# Patient Record
Sex: Female | Born: 1960 | Race: White | Hispanic: No | Marital: Married | Smoking: Never smoker
Health system: Southern US, Community
[De-identification: ages and names within clinical notes are randomized; demographics above are authoritative.]

## PROBLEM LIST (undated history)

## (undated) DIAGNOSIS — K2210 Ulcer of esophagus without bleeding: Secondary | ICD-10-CM

## (undated) DIAGNOSIS — K219 Gastro-esophageal reflux disease without esophagitis: Secondary | ICD-10-CM

## (undated) DIAGNOSIS — H269 Unspecified cataract: Secondary | ICD-10-CM

## (undated) DIAGNOSIS — E611 Iron deficiency: Secondary | ICD-10-CM

## (undated) DIAGNOSIS — E039 Hypothyroidism, unspecified: Secondary | ICD-10-CM

## (undated) DIAGNOSIS — L111 Transient acantholytic dermatosis [Grover]: Secondary | ICD-10-CM

## (undated) HISTORY — PX: TONSILLECTOMY AND ADENOIDECTOMY: SUR1326

## (undated) HISTORY — DX: Gastro-esophageal reflux disease without esophagitis: K21.9

## (undated) HISTORY — DX: Hypothyroidism, unspecified: E03.9

## (undated) HISTORY — PX: PATENT FORAMEN OVALE CLOSURE: SHX2181

## (undated) HISTORY — DX: Iron deficiency: E61.1

## (undated) HISTORY — DX: Transient acantholytic dermatosis (grover): L11.1

## (undated) HISTORY — PX: ADENOIDECTOMY: SUR15

## (undated) HISTORY — DX: Ulcer of esophagus without bleeding: K22.10

## (undated) HISTORY — PX: APPENDECTOMY: SHX54

---

## 1998-01-03 ENCOUNTER — Other Ambulatory Visit: Admission: RE | Admit: 1998-01-03 | Discharge: 1998-01-03 | Payer: Self-pay | Admitting: Obstetrics and Gynecology

## 1998-03-06 ENCOUNTER — Ambulatory Visit (HOSPITAL_COMMUNITY): Admission: RE | Admit: 1998-03-06 | Discharge: 1998-03-06 | Payer: Self-pay | Admitting: Internal Medicine

## 1999-02-07 ENCOUNTER — Other Ambulatory Visit: Admission: RE | Admit: 1999-02-07 | Discharge: 1999-02-07 | Payer: Self-pay | Admitting: Obstetrics and Gynecology

## 2000-05-30 ENCOUNTER — Other Ambulatory Visit: Admission: RE | Admit: 2000-05-30 | Discharge: 2000-05-30 | Payer: Self-pay | Admitting: Obstetrics and Gynecology

## 2000-11-17 ENCOUNTER — Other Ambulatory Visit: Admission: RE | Admit: 2000-11-17 | Discharge: 2000-11-17 | Payer: Self-pay | Admitting: Otolaryngology

## 2000-11-17 ENCOUNTER — Encounter (INDEPENDENT_AMBULATORY_CARE_PROVIDER_SITE_OTHER): Payer: Self-pay | Admitting: *Deleted

## 2001-03-27 ENCOUNTER — Encounter: Payer: Self-pay | Admitting: Gastroenterology

## 2001-04-07 ENCOUNTER — Encounter: Payer: Self-pay | Admitting: Gastroenterology

## 2001-04-20 ENCOUNTER — Ambulatory Visit (HOSPITAL_COMMUNITY): Admission: RE | Admit: 2001-04-20 | Discharge: 2001-04-20 | Payer: Self-pay | Admitting: Gastroenterology

## 2001-04-20 ENCOUNTER — Encounter: Payer: Self-pay | Admitting: Gastroenterology

## 2001-08-18 ENCOUNTER — Other Ambulatory Visit: Admission: RE | Admit: 2001-08-18 | Discharge: 2001-08-18 | Payer: Self-pay | Admitting: Obstetrics and Gynecology

## 2001-08-28 ENCOUNTER — Encounter (INDEPENDENT_AMBULATORY_CARE_PROVIDER_SITE_OTHER): Payer: Self-pay | Admitting: Specialist

## 2001-08-28 ENCOUNTER — Other Ambulatory Visit: Admission: RE | Admit: 2001-08-28 | Discharge: 2001-08-28 | Payer: Self-pay | Admitting: Radiology

## 2001-08-28 ENCOUNTER — Encounter: Admission: RE | Admit: 2001-08-28 | Discharge: 2001-08-28 | Payer: Self-pay | Admitting: General Surgery

## 2001-08-28 ENCOUNTER — Encounter: Payer: Self-pay | Admitting: General Surgery

## 2002-08-23 ENCOUNTER — Ambulatory Visit (HOSPITAL_COMMUNITY): Admission: RE | Admit: 2002-08-23 | Discharge: 2002-08-23 | Payer: Self-pay | Admitting: Neurology

## 2002-08-23 ENCOUNTER — Encounter: Payer: Self-pay | Admitting: Neurology

## 2002-08-25 ENCOUNTER — Other Ambulatory Visit: Admission: RE | Admit: 2002-08-25 | Discharge: 2002-08-25 | Payer: Self-pay | Admitting: Obstetrics and Gynecology

## 2002-11-16 ENCOUNTER — Encounter (INDEPENDENT_AMBULATORY_CARE_PROVIDER_SITE_OTHER): Payer: Self-pay | Admitting: *Deleted

## 2002-11-16 ENCOUNTER — Ambulatory Visit (HOSPITAL_BASED_OUTPATIENT_CLINIC_OR_DEPARTMENT_OTHER): Admission: RE | Admit: 2002-11-16 | Discharge: 2002-11-17 | Payer: Self-pay | Admitting: Otolaryngology

## 2003-04-25 ENCOUNTER — Encounter: Admission: RE | Admit: 2003-04-25 | Discharge: 2003-04-25 | Payer: Self-pay | Admitting: Chiropractic Medicine

## 2003-09-14 ENCOUNTER — Other Ambulatory Visit: Admission: RE | Admit: 2003-09-14 | Discharge: 2003-09-14 | Payer: Self-pay | Admitting: Obstetrics and Gynecology

## 2003-09-15 ENCOUNTER — Encounter: Admission: RE | Admit: 2003-09-15 | Discharge: 2003-09-15 | Payer: Self-pay | Admitting: Obstetrics and Gynecology

## 2003-10-31 ENCOUNTER — Encounter: Payer: Self-pay | Admitting: Internal Medicine

## 2004-04-18 ENCOUNTER — Encounter: Admission: RE | Admit: 2004-04-18 | Discharge: 2004-04-18 | Payer: Self-pay | Admitting: Obstetrics and Gynecology

## 2004-06-06 ENCOUNTER — Inpatient Hospital Stay (HOSPITAL_COMMUNITY): Admission: EM | Admit: 2004-06-06 | Discharge: 2004-06-09 | Payer: Self-pay | Admitting: Emergency Medicine

## 2004-10-12 ENCOUNTER — Other Ambulatory Visit: Admission: RE | Admit: 2004-10-12 | Discharge: 2004-10-12 | Payer: Self-pay | Admitting: Obstetrics and Gynecology

## 2006-07-14 ENCOUNTER — Inpatient Hospital Stay (HOSPITAL_COMMUNITY): Admission: AD | Admit: 2006-07-14 | Discharge: 2006-07-16 | Payer: Self-pay | Admitting: Internal Medicine

## 2006-08-05 ENCOUNTER — Ambulatory Visit (HOSPITAL_COMMUNITY): Admission: RE | Admit: 2006-08-05 | Discharge: 2006-08-05 | Payer: Self-pay | Admitting: Internal Medicine

## 2006-10-21 ENCOUNTER — Ambulatory Visit: Payer: Self-pay | Admitting: Cardiology

## 2006-10-21 ENCOUNTER — Inpatient Hospital Stay (HOSPITAL_COMMUNITY): Admission: EM | Admit: 2006-10-21 | Discharge: 2006-10-23 | Payer: Self-pay | Admitting: Emergency Medicine

## 2008-05-05 DIAGNOSIS — Z8719 Personal history of other diseases of the digestive system: Secondary | ICD-10-CM

## 2008-05-05 DIAGNOSIS — K449 Diaphragmatic hernia without obstruction or gangrene: Secondary | ICD-10-CM | POA: Insufficient documentation

## 2008-05-05 DIAGNOSIS — K222 Esophageal obstruction: Secondary | ICD-10-CM

## 2008-05-05 DIAGNOSIS — K219 Gastro-esophageal reflux disease without esophagitis: Secondary | ICD-10-CM | POA: Insufficient documentation

## 2008-05-05 DIAGNOSIS — J45909 Unspecified asthma, uncomplicated: Secondary | ICD-10-CM | POA: Insufficient documentation

## 2008-05-09 ENCOUNTER — Ambulatory Visit: Payer: Self-pay | Admitting: Internal Medicine

## 2008-05-09 DIAGNOSIS — K5901 Slow transit constipation: Secondary | ICD-10-CM

## 2008-05-19 ENCOUNTER — Ambulatory Visit: Payer: Self-pay | Admitting: Internal Medicine

## 2008-06-14 ENCOUNTER — Telehealth: Payer: Self-pay | Admitting: Internal Medicine

## 2008-07-04 ENCOUNTER — Ambulatory Visit: Payer: Self-pay | Admitting: Diagnostic Radiology

## 2008-07-04 ENCOUNTER — Emergency Department (HOSPITAL_BASED_OUTPATIENT_CLINIC_OR_DEPARTMENT_OTHER): Admission: EM | Admit: 2008-07-04 | Discharge: 2008-07-05 | Payer: Self-pay | Admitting: Emergency Medicine

## 2008-09-16 ENCOUNTER — Encounter (INDEPENDENT_AMBULATORY_CARE_PROVIDER_SITE_OTHER): Payer: Self-pay | Admitting: *Deleted

## 2008-11-22 ENCOUNTER — Encounter: Admission: RE | Admit: 2008-11-22 | Discharge: 2008-11-22 | Payer: Self-pay | Admitting: Allergy and Immunology

## 2010-08-02 LAB — BASIC METABOLIC PANEL
BUN: 13 mg/dL (ref 6–23)
CO2: 23 mEq/L (ref 19–32)
Calcium: 8.8 mg/dL (ref 8.4–10.5)
GFR calc Af Amer: >60 mL/min (ref >60)
Potassium: 3.6 mEq/L (ref 3.5–5.1)
Sodium: 139 mEq/L (ref 135–145)

## 2010-08-02 LAB — D-DIMER, QUANTITATIVE: D-Dimer, Quant: 0.31 ug/mL-FEU (ref 0.00–0.48)

## 2010-08-02 LAB — POCT CARDIAC MARKERS
CKMB, poc: <1 ng/mL — ABNORMAL LOW (ref 1.0–8.0)
Troponin i, poc: <0.05 ng/mL (ref 0.00–0.09)

## 2010-08-02 LAB — DIFFERENTIAL
Eosinophils Absolute: 0.4 10*3/uL (ref 0.0–0.7)
Eosinophils Relative: 4 % (ref 0–5)
Lymphocytes Relative: 27 % (ref 12–46)
Lymphs Abs: 2.6 10*3/uL (ref 0.7–4.0)
Monocytes Relative: 10 % (ref 3–12)

## 2010-08-02 LAB — CBC
Hemoglobin: 13.9 g/dL (ref 12.0–15.0)
MCHC: 34.7 g/dL (ref 30.0–36.0)
Platelets: 272 10*3/uL (ref 150–400)
RDW: 12.8 % (ref 11.5–15.5)

## 2010-09-04 NOTE — Discharge Summary (Signed)
NAMEBRANDA, CHAUDHARY               ACCOUNT NO.:  [id number]   MEDICAL RECORD NO.:  [id number]          PATIENT TYPE:  INP   LOCATION:  3027                         FACILITY:  [facility]   PHYSICIAN:  Pramod P. Pearlean Brownie, MD    DATE OF BIRTH:  01/31/1961   DATE OF ADMISSION:  10/21/2006  DATE OF DISCHARGE:  10/23/2006                               DISCHARGE SUMMARY   DIAGNOSIS AT TIME OF DISCHARGE:  1. 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      Etiology unclear at time of discharge.  2. Dyslipidemia.  3. Asthma.   MEDICATIONS:  At time of discharge:  1. Aspirin 81 mg a day.  2. Wellbutrin 300 mg a day.  3. Zocor 20 mg a day.   STUDIES PERFORMED:  1. CT of the brain on admission shows no acute abnormality.  2. MRI of the brain shows no acute infarct.  3. MRA of the brain was negative.  4. A 2-D echocardiogram was completed, results pending.  5. Carotid Doppler done, results pending.  6. Transcranial Doppler done, results pending.  7. EKG shows sinus rhythm with sinus arrhythmia, low-voltage QRS.   LABORATORY STUDIES:  CBC normal.  Chemistry normal.  Coagulation studies  normal.  Liver function tests normal.  Cardiac enzymes negative.  Cholesterol 192, triglycerides 91, HDL 59, LDL 115.  Urinalysis was  negative.  Hypercoagulable panel was pending.  Homocystine is 7.1,  hemoglobin A1c 5.2 and urine pregnancy test is negative.   HISTORY OF PRESENT ILLNESS:  [name] is a 50 year old  Caucasian female with a past medical history of asthma who presents  around 6:30 p.m. with right upper extremity flaccid paresis and numbness  with the patient unable to move her arm at all.  The patient states her  symptoms have gradually improved over 1 minute, especially after shaking  the arm.  She still complains of some slight right upper and lower  extremity tingling.  The patient then drove home from work and felt  tingling over her whole body.  She complained of a  faint-like feeling.  She states that when she was driving she also felt like her right lower  extremity was weak and clumsy.  However, she could use it appropriately.  She denies any visual changes, speech changes, swallowing problems,  chewing problems, vertigo or falls.  The patient did have a recent face  plastic surgery done the end of May with subsequent hematoma that  requires revision.  The patient was not a TPA candidate secondary to  quick resolution of symptoms.  She was admitted to the hospital for  further stroke evaluation.   HOSPITAL COURSE:  MRI did not reveal acute stroke, but given history it  is felt that the patient did have an acute stroke, not seen on MRI.  She  still has some mild right hemiparesis in her arm and her leg with some  dysmetria in her right upper extremity.  She was evaluated by PT and OT  and not felt to need outpatient therapy follow up, but exercises were  given for the patient to do at home.  She has no significant risk  factors other than a slightly elevated LDL of 115 for which she was  prescribed Zocor.  The patient states her diet has been poor within the  last one month, thinking that bacon may have increased her LDL as she  has always had low cholesterol levels.  She requests that she follow up  with her primary care physician before taking the medicines.  A  prescription has been provided along with education at the time of  discharge.  As patient also had no significant risk factors,  hypercoagulable panel was done in the hospital and results are pending  at time of discharge.  Have also asked her to follow up with Dr. Meryl Dare  to do an outpatient bubble study and emboli monitoring for possible  embolic source after discharge.  At this point, she is stable to be  discharged home.  She will take aspirin for stroke prevention.  She has  been asked to stop birth control pills.  Statin has been recommended.  It is advised that she not have  general anesthesia, surgery within the  next 2 months or be off aspirin during that time period as her stroke  risk is high.   CONDITION ON DISCHARGE:  The patient alert and oriented x3.  No aphasia.  No dysarthria.  Eye movements are full.  Face is symmetric.  No arm  drift.  She has decreased fine motor movement on her right.  Her right  arm orbits her left.  She has no lower extremity weakness.   DISCHARGE/PLAN:  1. Discharge home with family.  2. Aspirin for secondary stroke prevention.  3. Statin recommended.  The patient to discuss with primary care      physician.  Prescription provided.  4. Stop estrogen-containing birth control pills.  Progesterone only      pills are okay.  Have asked her to follow up with her OB/GYN.  5. The patient has advised to stay hydrated.  6. Outpatient bubble study and emboli monitoring with Dr. Meryl Dare within      the next month.   FOLLOW UP:  Dr. Kennedy Bucker within one week.      Annie Main, N.P.    ______________________________  Sunny Schlein. Pearlean Brownie, MD    SB/MEDQ  D:  10/23/2006  T:  10/24/2006  Job:  [id number]   cc:   Pramod P. Pearlean Brownie, MD  Gwen Pounds, MD

## 2010-09-04 NOTE — H&P (Signed)
NAMEBRITA, JURGENSEN               ACCOUNT NO.:  [id number]   MEDICAL RECORD NO.:  [id number]          PATIENT TYPE:  INP   LOCATION:  3027                         FACILITY:  [facility]   PHYSICIAN:  Bevelyn Buckles. Champey, M.D.DATE OF BIRTH:  [birth year]   DATE OF ADMISSION:  10/21/2006  DATE OF DISCHARGE:                              HISTORY & PHYSICAL   REQUESTING PHYSICIAN:  Dr. Freida Busman.   REASON FOR ADMISSION:  Code stroke.   HPI:  [name] is a 50 year old Caucasian female with a past medical  history of asthma who presents with the onset around 6:30 p.m. of right  upper extremity flaccid paresis and numbness, the patient could not move  her extremity at all.  The patient states her symptoms gradually  improved over 1 minute, especially after shaking the arm.  She still  complains of some slight right upper and lower extremity tingling.  The  patient then drove home from work and felt tingling over her entire  body.  She also complained of a faint-like feeling.  She states that  when she was driving, she also felt like her right lower extremity was  weak and clumsy; however, could use it appropriately.  She denies any  vision changes, speech changes, swallowing problems, chewing problems,  vertigo, or falls.  The patient did have a recent face plastic surgery  done at the end of May with subsequent hematoma.   PAST MEDICAL HISTORY:  Is positive for asthma.   CURRENT MEDICATIONS:  Include birth control, Wellbutrin, and Ventolin  nebs p.r.n.   ALLERGIES:  CODEINE.   FAMILY HISTORY:  Is positive for cancer.   SOCIAL HISTORY:  The patient lives with fiancee and father.  Denies any  alcohol or tobacco use.   REVIEW OF SYSTEMS:  Is positive as per HPI.  Reviews of systems negative  as per HPI in greater than 8 other organ systems.   EXAMINATION:  VITALS:  Temperature is 96.8, blood pressure 116/82, pulse  is 96, respirations 18, O2 sat is 98%.  HEENT:  Normocephalic, atraumatic.   Extraocular muscles are intact.  Visual fields are intact.  Pupils equal, round, reactive to light.  NECK:  Supple.  No carotid bruits.  HEART:  Regular.  LUNGS:  Clear.  ABDOMEN:  Soft.  EXTREMITIES:  Show good pulses.  NEUROLOGICAL EXAMINATION:  The patient is awake, alert.  Cranial nerves  II through XII are grossly intact.  Motor examination:  Patient might  have subtle 5-/5 strength in right lower extremity with a slight drift,  otherwise the patient has no drift in the other extremities and good  strength, 5/5, on the left.  Examination:  The patient does seem to have  good strength in the right upper extremity which is greatly improved, no  drift noted.  Sensory examination:  The patient has decreased sensation  to light touch and pinprick on the right when compared to the left.  She  does have the sensation.  No extension is noticed.  Reflexes are 1+.  Right toes are downgoing bilaterally.  Cerebellar function is within  normal  limits to finger-to-nose and heel-to-shin.  Gait is not assessed  secondary to safety.  NIH stroke scale was 2.   CT of head showed no acute bleed or infarct.   LABS:  Urine pregnancy was negative.  WBC is 8.4, hemoglobin 14.0,  hematocrit is 41.7, platelets 333.  Sodium is 139, potassium is 4.0,  chloride is 106, CO2 is 25, BUN 13, creatinine 0.9, glucose 95.   IMPRESSION:  This is a 50 year old Caucasian female with right-sided  weakness and numbness which has dramatically improved over the past few  hours.  The patient is not a candidate for intravenous tissue  plasminogen activator as her symptoms have dramatically improved.  We  will admit the patient to stroke MD service with the diagnosis of left  brain transient ischemic attack versus a small infarct.  We will get an  MRI/MRA of the brain, carotid Dopplers, 2-D echocardiogram, fasting  lipids, and homocysteine level.  We will start the patient on a baby  aspirin per day.  We will get Physical  Therapy/Occupational Therapy  consult.  I have recommended stopping birth control pills.  We will  place the patient on deep vein thrombosis prophylaxis.  We will follow  the patient.      Bevelyn Buckles. Nash Shearer, M.D.  Electronically Signed     DRC/MEDQ  D:  10/21/2006  T:  10/22/2006  Job:  [id number]

## 2010-09-07 NOTE — Op Note (Signed)
   NAME:  Kelsey Decker, Kelsey Decker                         ACCOUNT NO.:  [id number]   MEDICAL RECORD NO.:  [id number]                   PATIENT TYPE:  AMB   LOCATION:  DSC                                  FACILITY:  [facility]   PHYSICIAN:  Christopher E. Ezzard Standing, M.D.         DATE OF BIRTH:  1960/07/08   DATE OF PROCEDURE:  11/16/2002  DATE OF DISCHARGE:  11/17/2002                                 OPERATIVE REPORT   PREOPERATIVE DIAGNOSES:  1. Chronic tonsillitis.  2. Elongated uvula.   POSTOPERATIVE DIAGNOSES:  1. Chronic tonsillitis.  2. Elongated uvula.   PROCEDURE:  Tonsillectomy with uvulectomy.   SURGEON:  Kristine Garbe. Ezzard Standing, M.D.   ANESTHESIA:  General endotracheal anesthesia.   COMPLICATIONS:  None.   BRIEF CLINICAL NOTE:  The patient is a 50 year old female who has had  frequent sore throats as well as large cryptic tonsils with a white ring  within the tonsillar crypts.  She also has an elongated uvula with snoring  and is taken to the operating room at this time for a tonsillectomy and  uvulectomy.   DESCRIPTION OF PROCEDURE:  After adequate endotracheal anesthesia the  patient received 10 mg of Decadron IV preoperatively as well as 1 gram of  Ancef IV preoperatively.  The mouth gag was used to expose the oropharynx.  The left and right tonsils were dissected from the tonsillar fossa using  cautery.  Hemostasis was obtained with the cautery.  Following this the  uvula was transected at its base.  Hemostasis was obtained with the cautery.  This completed the procedure.  The patient tolerated the procedure well, was  awoke from anesthesia and transferred to the recovery room postoperatively  doing well.   DISPOSITION:  The patient is discharged home later this morning on  amoxicillin 400 mg b.i.d. for one week, Tylenol and Lortab elixir 15 to 30  mL q.4h p.r.n. pain.  She will have a follow-up in my office in two weeks  for recheck.             Kristine Garbe. Ezzard Standing, M.D.    CEN/MEDQ  D:  12/07/2002  T:  12/07/2002  Job:  [id number]

## 2010-09-07 NOTE — Discharge Summary (Signed)
[name], [name]               ACCOUNT NO.:  [id number]   MEDICAL RECORD NO.:  [id number]          PATIENT TYPE:  INP   LOCATION:  5503                         FACILITY:  [facility]   PHYSICIAN:  Corinna L. Lendell Caprice, MDDATE OF BIRTH:  12-11-1960   DATE OF ADMISSION:  06/06/2004  DATE OF DISCHARGE:  06/09/2004                                 DISCHARGE SUMMARY   DISCHARGE DIAGNOSES:  1.  Acute bronchitis with exacerbation of asthma.  2.  Steroid-induced hyperglycemia, resolved.  3.  Steroid induced anxiety and insomnia.   DISCHARGE MEDICATIONS:  1.  Advair 100/50 one puff twice a day.  2.  Prednisone taper over 6 days.  3.  Avelox 600 mg p.o. q.d. for three more days.  4.  Albuterol nebulizer or MDI q.4 h p.r.n. wheeze.  5.  Ambien 5 mg p.o. q.h.s. p.r.n. sleep.   ACTIVITY:  She may return to work in 1 week.   DIET:  As tolerated.   CONDITION ON DISCHARGE:  Stable.   CONSULTATIONS:  None.   PROCEDURES:  None.   PERTINENT LABORATORY DATA:  For complete metabolic panel on admission was  significant for a bicarbonate of 17, otherwise unremarkable.  CBC on  admission was significant for white blood cell count 12.7 with 80%  neutrophils, otherwise unremarkable.  UA was negative, nitrite negative  leukocyte esterase, 15 ketones, many epithelial cells.  ABG, on an unknown  amount of oxygen as it is not recorded, showed a pH of 7.597, pCO2 of 17.9,  pO2 of 85, bicarbonate 17.   EKG showed normal sinus rhythm.  Chest x-ray showed hyperinflation, mild  peribronchial thickening, no pneumonia.   HOSPITAL COURSE:  [name] is a pleasant, 50 year old, white female with  asthma who had previously been cared for by Dr. Sherene Sires who presented to the  emergency room with a status asthmaticus.  She had been given an antibiotic  and IM steroids, specifically Kenalog.  She refused oral steroids as an  outpatient and she was not improving.  On physical examination, she had a  pulse of 100.5,  otherwise her vital signs were normal.  She had decreased  breath sounds and diffuse expiratory wheezes.  She was admitted to the  floor, started on IV steroids, nebulizers, oxygen and IV Avelox.  Her  sugars, while on IV steroids, were in the 200s, but upon tapering, they  normalized.  The patient's wheezing and dyspnea improved and at discharge  she was able to walk the halls without difficulty.  She was feeling better  unable to be discharged.  The patient had been on Advair and multiple other  medications as an outpatient which she stopped on her own, but she agrees to  try resuming Advair as an outpatient.  She wishes to follow up with a pulmonologist at [facility], which I feel as  reasonable.  If she is unable to get an appointment within 4 weeks, I did,  however, recommend that she follow up with primary care physician of her  choice.      CLS/MEDQ  D:  06/09/2004  T:  06/11/2004  Job:  [id number]   cc:   Louanna Raw  [address]  [place]  [place] [address]  Fax: [phone]

## 2010-09-07 NOTE — Discharge Summary (Signed)
Kelsey Decker, PEKAR NO.:  [id number]   MEDICAL RECORD NO.:  [id number]          PATIENT TYPE:  OUT   LOCATION:  PULM                         FACILITY:  [facility]   PHYSICIAN:  Gwen Pounds, MD       DATE OF BIRTH:  1961-03-04   DATE OF ADMISSION:  08/05/2006  DATE OF DISCHARGE:  08/05/2006                               DISCHARGE SUMMARY   DISCHARGE DIAGNOSES:  1. Asthma exacerbation.  2. Anxiety secondary to the underlying asthma exacerbation.  3. Chronic asthma.  4. History of sinus surgery.  5. Tonsillectomy and adenoidectomy.  6. Appendectomy.   DISCHARGE MEDICATION LIST:  1. Includes Yaz as directed for oral contraception.  2. Albuterol HSA 2 puffs 4 times per day as needed.  3. DuoNeb up to 4 times per day as needed.  4. Ambien 5 mg to 10 mg p.o. nightly as needed.  5. Asmanex TwistHaler 220 mcg per activation, 2 puffs one time per day      for the next week, and then one puff one time per day thereafter.  6. Singulair 10 mg p.o. daily.  7. Prednisone 40 p.o. daily today and tomorrow, and 20 mg daily x4      days, 10 mg daily x4 days, then 1/2 tablet of the 10-mg's to equal      5 mg daily for 4 days.   DISCHARGE PROCEDURES:  Include medication management.   HISTORY OF PRESENT ILLNESS:  Briefly, the patient is a 50 year old woman  who I saw in my office on 07/14/2006, for asthma exacerbation, shortness  of breath.  In my office, she had DuoNeb and Xopenex.  This did not help  her move air very well and she remained nervous, she remained with a  significant cough, she remained with significant tachypnea, and  worsening tachycardia with the feeling of doom.  It was determined that  she needed inpatient admission for further evaluation and treatment.  She was then monitored for respiratory distress and sent on for  inpatient admission.  In the office, we also gave her 80 mg of IM Solu-  Medrol.   HOSPITAL COURSE:  The patient was admitted from my  office on 07/14/2006,  with significant asthma exacerbation.  She was given Xopenex and DuoNeb  in the office followed by IM Solu-Medrol.  Once she got to the floor of  the hospital, she was given oxygen for comfort.  She was given Solu-  Medrol IV.  She was given multiple nebulizer treatments.  Her sputum was  sent for culture and Gram stain.  She was placed on Lovenox for DVT  prophylaxis.  She was given Ativan for underlying anxiety related to the  inability to breathe.  Laboratories were ordered and a chest x-ray was  obtained.  Because of the aggressive treatment, she improved overnight.  On 07/15/2006, she was without expiratory wheezing, she was no longer in  respiratory distress, all her vital signs remained stable, and oxygen  saturations were fine.  Her laboratories were appropriate.  Her chest x-  ray was compatible with asthma without  pneumonia.  She was no longer  tachypneic.  Her anxiety level was markedly decreased.  We decided that  because of how severe her asthma exacerbation was and the moderate  respiratory distress, that we would follow for 24 more hours.  She was  continued on oxygen on a p.r.n. basis.  She was continued on inhaled  steroids.  Her IV Solu-Medrol was switched over the oral steroids, and  we continued the around-the-clock nebulizer treatment and Singulair was  added.  Her peak flow prior to nebulizers was about 150 mL, after  nebulizers was about 250 mL.  The next morning, on 07/16/2006, her  asthma was much improved, she slept well, and she was markedly better  physically.  She had minimal wheezing on exam, her vitals remained  stable, and it was determined that she was okay for discharge.  A long  discussion was had about medications, and it was determined that she  will follow up with me in one week.  She was told to drink plenty of  water.  She was to slowly return to her baseline activities.  She was  return to her baseline diet.  All questions  were answered, and she was  allowed to go home in stable condition.  During the office visit and the  hospital stay, we had a long discussion on outpatient follow-up and how  to keep her from potentially needing hospitalizations in the future.  We  also talked about outpatient pulmonary function tests, and we also  talked about the prior workup that she had with Dr. Caldwell Callas and Dr. Sherene Sires,  as to whether she should undergo immunotherapy and what other types of  treatments she should do to avoid future exacerbation risk.  These  conversations will be continued as an outpatient.      Gwen Pounds, MD  Electronically Signed     JMR/MEDQ  D:  09/02/2006  T:  09/02/2006  Job:  [id number]

## 2010-09-07 NOTE — H&P (Signed)
[name], Kelsey Decker NO.:  [id number]   MEDICAL RECORD NO.:  [id number]          PATIENT TYPE:  INP   LOCATION:  4714                         FACILITY:  [facility]   PHYSICIAN:  Gwen Pounds, MD       DATE OF BIRTH:  [birth year]   DATE OF ADMISSION:  07/14/2006  DATE OF DISCHARGE:                              HISTORY & PHYSICAL   PRIMARY CARE [place] Heady:  Is myself.   PULMONOLOGIST:  Dr. Sherene Sires.   CHIEF COMPLAINT:  Asthma exacerbation, shortness of breath.   HISTORY OF PRESENT ILLNESS:  This is a 50 year old female with known  asthma for greater than 20 years and has had history of numerous  hospitalizations in the past.  The last one was well over 2 years ago.  She was seen in December at urgent care on June 08, 2006 at urgent  care with asthma exacerbations.  She recovered, but after the last one  it went into complete recovery.  She called center this morning with  severe symptoms of shortness of breath, cough, white phlegm, chest  tightness, wheezing and anxiety.  She was told to come over immediately.  As soon as she got here, she got DuoNeb and Xopenex back to back.  Got  her from barely being able to move air to a peak flow of about 230  liters per minute and feeling some better, but because of the increase  cough, tachypnea, tachycardia and the feeling of impending doom, we will  admit for further evaluation and treatment.  She has been giving herself  up to 6 nebs per day, doing DuoNebs at home, also doing p.r.n. Ventolin.  In the office, we gave her 80 mg of IM Solu-Medrol and then called over  to get a room at the hospital.  The last time she has been able to  exercise was greater than a month ago.  She is to see Dr. Sherene Sires and Dr.  [place] Callas who wanted to do steroid injections, also we tried her on  multiple medications including Advair and all this other stuff that she  felt like did not work.  I have not seen her in over a year.  She  reports that she has  tried to make appointments to come and see me, but  it is always for an acute visit and we just have not been able to work  things out.  We discussed how we are going to be able to work things out  better and for the future to keep from getting this sick.   PAST MEDICAL HISTORY:  Includes:  1. Asthma since a child.  She was hospitalized often as a child and      especially in the last 47s.  Her last hospitalization was      February of 2006.  2. History of sinus surgery.  3. History of tonsil and adenoidectomy in 1999.  4. History of cesarean section.  5. History of appendectomy.   SOCIAL HISTORY:  She is single, has a 78+-year-old daughter.  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  She does not smoke and rarely drinks.   Her father is about 49 years old.  Mother died of colon cancer.   SHE IS ALLERGIC TO CODEINE, WHICH CAUSES HIVES.   MEDICATIONS:  List includes:  1. Oral contraceptive pill.  2. Albuterol HFA.  3. Ambien p.r.n.  4. Asmanex twice a day.  5. DuoNeb 4-6 times per day at this current moment.   REVIEW OF SYSTEMS:  She denies any fevers, chills, night sweats or  weight change.  She denies ears, nose and throat symptoms.  She denies  any headache, sinus congestion or voice changes.  She denies any skin  issues.  She is short of breath.  She is dyspneic on exertion.  She has  cough with wheezing.  She has got white phlegm.  Denies any urinary  symptoms.  Denies any nausea, vomiting or diarrhea and she is very  anxious and upset.   PHYSICAL EXAMINATION:  VITAL SIGNS:  Pulse is 112.  Respiratory rate is  about 20-22.  Blood pressure 98/62.  Saturating 98% on room air.  GENERAL:  She is alert and oriented x3.  She is tachypnic and looks ill.  HEENT:  PERRL.  EOMI.  Sclerae is red.  Nares without discharge.  Oropharynx is moist.  NECK:  Without  lymphadenopathy.  CARDIAC:  Regular with tachycardia.  PULMONARY:  Wheezing throughout and moving air poorly.  She has got a   barking cough.  ABDOMEN:  Soft.  EXTREMITIES:  No edema.  NEUROLOGIC:  Intact.  She is very anxious and near tears.   ANCILLARY:  She was given a Xopenex, DuoNeb and treatments in the  office.  Oxygen was given in the office.   ASSESSMENT:  This is a 50 year old woman with known asthma, being  admitted to the hospital with an asthma exacerbation.   PLAN:  1. Admit.  2. Oxygen for comfort.  3. She already got Depo-Medrol in the office as well and will need to      be on IV Solu-Medrol.  4. Multiple nebulizer treatments have been ordered.  5. Singulair was started.  6. We will check baseline labs.  7. We will send sputum for culture.  8. Lovenox for deep venous thrombosis prophylaxis.  9. Ativan for anxiety.  10.Hopefully, she recovers quickly and does not worsen.  If she does      worsens and if Dr. Sherene Sires of the pulmonologist, she requests that we      do not use them unless she does get sicker.  At this point, I do      not see      where antibiotics are indicated, but we will check a chest x-ray to      see.  I expect her white count to go up a little bit considering I      did give her some steroids.  At this point in time, I think the      appropriate thing to do is to treat her asthma and get her calmed      down and kind of see where we are left and go from there.      Gwen Pounds, MD  Electronically Signed     JMR/MEDQ  D:  07/14/2006  T:  07/14/2006  Job:  [id number]   cc:   Charlaine Dalton. Sherene Sires, MD, FCCP  Percival Spanish, M.D.

## 2011-02-05 LAB — CK TOTAL AND CKMB (NOT AT ARMC)
CK, MB: 1.3
Relative Index: INVALID
Total CK: 90

## 2011-02-05 LAB — CBC
HCT: 41.7
Hemoglobin: 14
MCHC: 33.6

## 2011-02-05 LAB — I-STAT 8, (EC8 V) (CONVERTED LAB)
Acid-base deficit: 2
BUN: 13
Bicarbonate: 23.4
Chloride: 106
Glucose, Bld: 95
HCT: 46
Hemoglobin: 15.6 — ABNORMAL HIGH
Operator id: 196461
Potassium: 4
Sodium: 139
TCO2: 25
pCO2, Ven: 43.1 — ABNORMAL LOW
pH, Ven: 7.343 — ABNORMAL HIGH

## 2011-02-05 LAB — BETA-2-GLYCOPROTEIN I ABS, IGG/M/A
Beta-2 Glyco I IgG: <4 U/mL (ref <20)
Beta-2-Glycoprotein I IgA: <4 U/mL (ref <10)
Beta-2-Glycoprotein I IgM: <4 U/mL (ref <10)

## 2011-02-05 LAB — APTT: aPTT: 27

## 2011-02-05 LAB — COMPREHENSIVE METABOLIC PANEL
ALT: 34
AST: 30
Alkaline Phosphatase: 62
Chloride: 108
Creatinine, Ser: 0.81
GFR calc non Af Amer: >60
Glucose, Bld: 97
Potassium: 4
Sodium: 137
Total Bilirubin: 0.8

## 2011-02-05 LAB — LUPUS ANTICOAGULANT PANEL
DRVVT: 47.6 — ABNORMAL HIGH (ref 36.1–47.0)
Lupus Anticoagulant: NOT DETECTED
PTT Lupus Anticoagulant: 43.3 (ref 36.3–48.8)
dRVVT Incubated 1:1 Mix: 39.2 (ref 36.1–47.0)

## 2011-02-05 LAB — POCT I-STAT CREATININE
Creatinine, Ser: 0.9
Operator id: 196461

## 2011-02-05 LAB — DIFFERENTIAL
Basophils Relative: 1
Lymphocytes Relative: 32
Lymphs Abs: 2.7
Monocytes Relative: 12 — ABNORMAL HIGH
Neutro Abs: 4.3
Neutrophils Relative %: 52

## 2011-02-05 LAB — LIPID PANEL
LDL Cholesterol: 115 — ABNORMAL HIGH
VLDL: 18

## 2011-02-05 LAB — PROTIME-INR
INR: 1.1
Prothrombin Time: 14.2

## 2011-02-05 LAB — PROTEIN C ACTIVITY: Protein C Activity: 150 % — ABNORMAL HIGH (ref 91–147)

## 2011-02-05 LAB — PROTEIN S, TOTAL: Protein S Ag, Total: 101 % (ref 70–140)

## 2011-02-05 LAB — COMPREHENSIVE METABOLIC PANEL WITH GFR
Albumin: 4
BUN: 12
CO2: 24
Calcium: 9.4
GFR calc Af Amer: >60
Total Protein: 6.9

## 2011-02-05 LAB — CARDIOLIPIN ANTIBODIES, IGG, IGM, IGA
Anticardiolipin IgA: <7 — ABNORMAL LOW (ref <13)
Anticardiolipin IgG: <7 — ABNORMAL LOW (ref <11)
Anticardiolipin IgM: 12 (ref <10)

## 2011-02-05 LAB — FACTOR 5 LEIDEN

## 2011-02-05 LAB — HEMOGLOBIN A1C: Hgb A1c MFr Bld: 5.2

## 2011-02-05 LAB — URINALYSIS, ROUTINE W REFLEX MICROSCOPIC
Hgb urine dipstick: NEGATIVE
Nitrite: NEGATIVE
Specific Gravity, Urine: 1.019

## 2011-02-05 LAB — HOMOCYSTEINE: Homocysteine: 7.2

## 2011-02-05 LAB — PROTHROMBIN GENE MUTATION

## 2011-02-05 LAB — ANTITHROMBIN III: AntiThromb III Func: 95

## 2011-02-05 LAB — PROTEIN C, TOTAL: Protein C, Total: 85 % (ref 70–140)

## 2011-02-05 LAB — TROPONIN I: Troponin I: 0.01

## 2011-02-05 LAB — PROTEIN S ACTIVITY: Protein S Activity: 105 % (ref 81–180)

## 2011-08-30 ENCOUNTER — Telehealth: Payer: Self-pay | Admitting: Internal Medicine

## 2011-08-30 NOTE — Telephone Encounter (Signed)
Spoke with pt and she states she is having problems with constipation and abdominal pain. Pt requesting to be seen. Pt scheduled to see Amy Esterwood PA 09/02/11@10 :30am. Pt aware of appt date and time.

## 2011-09-02 ENCOUNTER — Other Ambulatory Visit (INDEPENDENT_AMBULATORY_CARE_PROVIDER_SITE_OTHER): Payer: PRIVATE HEALTH INSURANCE

## 2011-09-02 ENCOUNTER — Ambulatory Visit (INDEPENDENT_AMBULATORY_CARE_PROVIDER_SITE_OTHER): Payer: PRIVATE HEALTH INSURANCE | Admitting: Physician Assistant

## 2011-09-02 ENCOUNTER — Encounter: Payer: Self-pay | Admitting: Physician Assistant

## 2011-09-02 VITALS — BP 100/70 | HR 76 | Ht 63.0 in | Wt 150.0 lb

## 2011-09-02 DIAGNOSIS — E611 Iron deficiency: Secondary | ICD-10-CM

## 2011-09-02 DIAGNOSIS — K5909 Other constipation: Secondary | ICD-10-CM

## 2011-09-02 DIAGNOSIS — K5900 Constipation, unspecified: Secondary | ICD-10-CM

## 2011-09-02 LAB — IBC PANEL
Iron: 129 ug/dL (ref 42–145)
Saturation Ratios: 33.8 % (ref 20.0–50.0)
Transferrin: 272.4 mg/dL (ref 212.0–360.0)

## 2011-09-02 LAB — TSH: TSH: 0.84 u[IU]/mL (ref 0.35–5.50)

## 2011-09-02 MED ORDER — INTEGRA PLUS PO CAPS: 1.0000 | ORAL_CAPSULE | Freq: Every day | ORAL | Status: DC

## 2011-09-02 MED ORDER — LINACLOTIDE 290 MCG PO CAPS: 1.0000 | ORAL_CAPSULE | Freq: Every day | ORAL | Status: DC

## 2011-09-02 NOTE — Patient Instructions (Addendum)
Please go to the basement level to have your labs drawn.  We sent a prescription for Integra Iron supplement to [facility] [address]  You can wait until you complete the Stool card and results for iron studies before you start the iron supplements.  Follow up with Dr. Juanda Chance in 3-5 weeks.  We have given you samples of Linzess. Take 1 daily.   We have given you Stool cards for 3 separate days. Once you complete them you can either mail them or bring them back to our lab in the basement level.

## 2011-09-02 NOTE — Progress Notes (Signed)
Subjective:    Patient ID: Kelsey Decker, female    DOB: 07/04/1960, 51 y.o.   MRN: [id number]  HPI Kelsey Decker is a pleasant generally healthy 51 year old white female known to Dr. Juanda Chance  from prior colonoscopies. She has family history of colon cancer in her mother. Patient's last colonoscopy was done in January of 2010 which was completely negative except for finding of melanosis. She has history of hypothyroidism and status post appendectomy and C-section. She is going through menopause currently and is having many symptoms which she is finding frustrating. She does have history of chronic constipation for many years and at this point has to take a laxative at least twice weekly in order to have a bowel movement. She says usually starts with MiraLax and if that is not effective takes Dulcolax.  She does not have a BM  without a laxative and gets bloated and uncomfortable on a regular basis. She has not noted any melena or hematochezia and really has not had any change in her bowel habits recently. She has had recent GYN evaluation and was told that her ferritin was low earlier this year. She was asked to take an over-the-counter iron supplement which she has not been  Taking  because it makes her more constipated. She says she has had problems with iron deficiency off and on over the years.  She comes in today because she's frustrated with the constipation and abdominal bloating.    Review of Systems  Constitutional: Positive for fatigue.  HENT: Negative.   Eyes: Negative.   Respiratory: Negative.   Cardiovascular: Negative.   Gastrointestinal: Positive for constipation.  Genitourinary: Negative.   Musculoskeletal: Negative.   Skin: Negative.   Neurological: Negative.   Hematological: Negative.   Psychiatric/Behavioral: Negative.    Outpatient Encounter Prescriptions as of 09/02/2011  Medication Sig Dispense Refill  . albuterol (PROVENTIL) (2.5 MG/3ML) 0.083% nebulizer solution Take  2.5 mg by nebulization every 6 (six) hours as needed.      Marland Kitchen buPROPion (WELLBUTRIN XL) 300 MG 24 hr tablet Take 300 mg by mouth daily.      . Estradiol 0.25 MG/0.25GM GEL Place 1 Syringe onto the skin daily.      Marland Kitchen levothyroxine (SYNTHROID, LEVOTHROID) 25 MCG tablet Take 25 mcg by mouth daily.      . progesterone (ENDOMETRIN) 100 MG vaginal insert Place 100 mg vaginally daily.      Marland Kitchen testosterone (ANDROGEL) 50 MG/5GM GEL Place 5 g onto the skin daily.      Marland Kitchen zolpidem (AMBIEN) 10 MG tablet Take 10 mg by mouth at bedtime as needed.      . FeFum-FePoly-FA-B Cmp-C-Biot (INTEGRA PLUS) CAPS Take 1 capsule by mouth daily.  30 capsule  1  . Linaclotide (LINZESS) 290 MCG CAPS Take 1 capsule by mouth daily.  12 capsule  0    Allergies  Allergen Reactions  . Codeine    Patient Active Problem List  Diagnoses  . ASTHMA, UNSPECIFIED, UNSPECIFIED STATUS  . ESOPHAGEAL STRICTURE  . GERD  . HIATAL HERNIA  . SLOW TRANSIT CONSTIPATION  . ESOPHAGITIS, HX OF       Objective:   Physical Exam well-developed white female in no acute distress, pleasant blood pressure 100/70 pulse 76 height 5 foot 3 weight 150. HEENT; nontraumatic normocephalic EOMI PERRLA sclera anicteric, Cardiovascular; regular rate and rhythm with S1 and S2 no murmur rub or gallop, Pulmonary; clear bilaterally, Abdomen soft nontender nondistended bowel sounds are active there is no palpable  mass or hepatosplenomegaly, Rectal; exam not done, Extremities; no clubbing cyanosis or edema, Psych; mood and affect normal and appropriate        Assessment & Plan:  #58 51 year old female with family history of colon cancer in her mother who presents with chronic constipation. #2 iron deficiency #3 hypothyroidism  Plan; patient is up-to-date on colonoscopy with negative colonoscopy January of 2010. Will start trial of  Linzess  290 mcg once daily, samples were given today and offer prescription if she finds it  helpful. Check TSH, CBC iron  studies and Hemoccults today. If  Linzess is  effective for her constipation would like to add Integra + one  daily for iron deficiency. We have sent a prescription today, she will try the  Linzess  first and then add the Integra. Followup with Dr. Lina Sar in 3-4 weeks.

## 2011-09-02 NOTE — Progress Notes (Signed)
Reviewed and agree with management plan. Claramae Rigdon T. Thinh Cuccaro MD FACG 

## 2011-09-12 ENCOUNTER — Encounter: Payer: Self-pay | Admitting: *Deleted

## 2011-10-01 ENCOUNTER — Ambulatory Visit: Payer: PRIVATE HEALTH INSURANCE | Admitting: Internal Medicine

## 2011-10-01 ENCOUNTER — Encounter: Payer: Self-pay | Admitting: Internal Medicine

## 2011-10-01 ENCOUNTER — Encounter: Payer: Self-pay | Admitting: Gastroenterology

## 2011-10-14 ENCOUNTER — Other Ambulatory Visit: Payer: PRIVATE HEALTH INSURANCE

## 2011-10-14 LAB — HEMOCCULT SLIDES (X 3 CARDS): Fecal Occult Blood: NEGATIVE

## 2011-10-15 ENCOUNTER — Ambulatory Visit: Payer: PRIVATE HEALTH INSURANCE | Admitting: *Deleted

## 2012-04-13 ENCOUNTER — Other Ambulatory Visit: Payer: Self-pay | Admitting: Obstetrics and Gynecology

## 2013-04-03 ENCOUNTER — Encounter: Payer: Self-pay | Admitting: Internal Medicine

## 2013-09-22 ENCOUNTER — Encounter: Payer: Self-pay | Admitting: Internal Medicine

## 2014-03-15 ENCOUNTER — Other Ambulatory Visit: Payer: Self-pay | Admitting: Obstetrics and Gynecology

## 2014-03-18 LAB — CYTOLOGY - PAP

## 2014-08-29 ENCOUNTER — Other Ambulatory Visit (HOSPITAL_COMMUNITY): Payer: Self-pay | Admitting: Physical Medicine and Rehabilitation

## 2014-08-29 DIAGNOSIS — M5013 Cervical disc disorder with radiculopathy, cervicothoracic region: Secondary | ICD-10-CM

## 2014-09-06 ENCOUNTER — Ambulatory Visit (HOSPITAL_COMMUNITY)
Admission: RE | Admit: 2014-09-06 | Discharge: 2014-09-06 | Disposition: A | Discharge status: Home / Self Care | Payer: 59 | Source: Ambulatory Visit | Attending: Physical Medicine and Rehabilitation | Admitting: Physical Medicine and Rehabilitation

## 2014-09-06 DIAGNOSIS — M5013 Cervical disc disorder with radiculopathy, cervicothoracic region: Secondary | ICD-10-CM

## 2014-09-13 ENCOUNTER — Encounter: Payer: Self-pay | Admitting: Internal Medicine

## 2014-12-24 DIAGNOSIS — T63441A Toxic effect of venom of bees, accidental (unintentional), initial encounter: Secondary | ICD-10-CM | POA: Insufficient documentation

## 2014-12-24 DIAGNOSIS — J3089 Other allergic rhinitis: Secondary | ICD-10-CM

## 2014-12-24 DIAGNOSIS — J4550 Severe persistent asthma, uncomplicated: Secondary | ICD-10-CM | POA: Insufficient documentation

## 2014-12-30 ENCOUNTER — Other Ambulatory Visit: Payer: Self-pay

## 2014-12-30 MED ORDER — OMALIZUMAB 150 MG ~~LOC~~ SOLR
150.0000 mg | SUBCUTANEOUS | Status: DC
  Administered 2015-02-01 – 2019-10-15 (×28): 150 mg via SUBCUTANEOUS

## 2015-02-01 ENCOUNTER — Ambulatory Visit (INDEPENDENT_AMBULATORY_CARE_PROVIDER_SITE_OTHER): Payer: 59 | Admitting: Allergy and Immunology

## 2015-02-01 ENCOUNTER — Ambulatory Visit (INDEPENDENT_AMBULATORY_CARE_PROVIDER_SITE_OTHER): Payer: 59

## 2015-02-01 VITALS — BP 108/64 | HR 78 | Resp 18

## 2015-02-01 DIAGNOSIS — J4551 Severe persistent asthma with (acute) exacerbation: Secondary | ICD-10-CM

## 2015-02-01 DIAGNOSIS — J4550 Severe persistent asthma, uncomplicated: Secondary | ICD-10-CM

## 2015-02-01 DIAGNOSIS — J309 Allergic rhinitis, unspecified: Secondary | ICD-10-CM | POA: Diagnosis not present

## 2015-02-01 DIAGNOSIS — J4541 Moderate persistent asthma with (acute) exacerbation: Secondary | ICD-10-CM | POA: Insufficient documentation

## 2015-02-01 DIAGNOSIS — T63441D Toxic effect of venom of bees, accidental (unintentional), subsequent encounter: Secondary | ICD-10-CM | POA: Diagnosis not present

## 2015-02-01 DIAGNOSIS — J4540 Moderate persistent asthma, uncomplicated: Secondary | ICD-10-CM | POA: Diagnosis not present

## 2015-02-01 DIAGNOSIS — J3089 Other allergic rhinitis: Secondary | ICD-10-CM

## 2015-02-01 MED ORDER — MOMETASONE FURO-FORMOTEROL FUM 200-5 MCG/ACT IN AERO: 2.0000 | INHALATION_SPRAY | Freq: Two times a day (BID) | RESPIRATORY_TRACT | Status: DC

## 2015-02-01 MED ORDER — EPINEPHRINE 0.3 MG/0.3ML IJ SOAJ: 0.3000 mg | Freq: Once | INTRAMUSCULAR | Status: DC

## 2015-02-01 MED ORDER — ALBUTEROL SULFATE HFA 108 (90 BASE) MCG/ACT IN AERS: 2.0000 | INHALATION_SPRAY | RESPIRATORY_TRACT | Status: DC | PRN

## 2015-02-01 NOTE — Assessment & Plan Note (Signed)
   A prescription has been provided for fluticasone nasal spray, one spray per nostril 1-2 times daily as needed.   Continue nasal saline irrigation as needed.

## 2015-02-01 NOTE — Patient Instructions (Addendum)
Asthma with acute exacerbation  Prednisone has been provided, 20 mg x 4 days, 10 mg x1 day, then stop.  [facility] (mometasone/formoterol) 200/5 g, 2 inhalations via spacer device twice a day.   Continue omalizumab every 2 weeks.   Subjective and objective measures of pulmonary function will be followed and the treatment plan will be adjusted accordingly.  She has been asked to inform me if her symptoms persist or progress.   Perennial allergic rhinitis  A prescription has been provided for fluticasone nasal spray, one spray per nostril 1-2 times daily as needed.   Continue nasal saline irrigation as needed.   Bee sting reaction  Continue insect avoidance and have access to epinephrine autoinjector 2 pack.  If the patient is off of omalizumab for at least 6 weeks we will check serum specific IgE to hymenoptera venom panel.    Return in about 6 weeks (around 03/15/2015), or if symptoms worsen or fail to improve.

## 2015-02-01 NOTE — Assessment & Plan Note (Addendum)
   Prednisone has been provided, 20 mg x 4 days, 10 mg x1 day, then stop.  [facility] (m[facility]/formoterol) 200/5 g, 2 inhalations via spacer device twice a day.   Continue omalizumab every 2 weeks.   Subjective and objective measures of pulmonary function will be followed and the treatment plan will be adjusted accordingly.  She has been asked to inform me if her symptoms persist or progress.

## 2015-02-01 NOTE — Assessment & Plan Note (Signed)
   Continue insect avoidance and have access to epinephrine autoinjector 2 pack.  If the patient is off of omalizumab for at least 6 weeks we will check serum specific IgE to hymenoptera venom panel.

## 2015-02-01 NOTE — Progress Notes (Addendum)
History of present illness: HPI Comments: This 54 year old female with severe persistent asthma, allergic rhinitis, and hymenoptera venom hypersensitivity presents today for sick visit. Kelsey Decker reports that her asthma had been well-controlled while receiving omalizumab injections every 2 weeks.  However, due to insurance her therapy was interrupted.  During that time, she began to experience frequent dyspnea, coughing, chest tightness, and wheezing.  As a result, she was recently treated the local urgent care on 2 occasions.  Omalizumab was restarted 1 month ago however she is currently experiencing frequent dyspnea, coughing, chest tightness and wheezing as well as nocturnal awakenings due to lower respiratory symptoms.  She uses nasal saline irrigation but is experiencing occasional nasal congestion and postnasal drainage. She is uncertain if this is due to a recent upper respiratory tract infection or her allergies. On July 4, she was seen in the emergency department for a systemic reaction to an insect sting.  She has had 4 systemic reactions related insect stings over the course of her lifetime.  She carries an EpiPen 2 pack but did not use it during the reaction in July.     Assessment and plan: Asthma with acute exacerbation  Prednisone has been provided, 20 mg x 4 days, 10 mg x1 day, then stop.  [facility] 200/5 g, 2 inhalations via spacer device twice a day.   Continue omalizumab every 2 weeks.   Subjective and objective measures of pulmonary function will be followed and the treatment plan will be adjusted accordingly.  She has been asked to inform me if her symptoms persist or progress.   Perennial allergic rhinitis  A prescription has been provided for fluticasone nasal spray, one spray per nostril 1-2 times daily as needed.   Continue nasal saline irrigation as needed.   Bee sting reaction  Continue insect avoidance  and have access to epinephrine autoinjector 2 pack.  If the patient is off of omalizumab for at least 6 weeks we will check serum specific IgE to hymenoptera venom panel.    Medications ordered this encounter: Meds ordered this encounter  Medications  . mometasone-formoterol (DULERA) 200-5 MCG/ACT AERO    Sig: Inhale 2 puffs into the lungs 2 (two) times daily. To prevent cough or wheeze.  Rinse, gargle and spit with water after use.    Dispense:  1 Inhaler    Refill:  2                                                 Diagnositics: Spirometry reveals FVC of 2.60 L (88% predicted) and FEV1 of 1.74 L (72% predicted with significant (340 mL, 20%) post bronchodilator improvement.     Physical examination: Blood pressure 108/64, pulse 78, resp. rate 18.  General: Alert, interactive, in no acute distress. HEENT: TMs pearly gray, turbinates minimally edematous without discharge, post-pharynx mildly erythematous. Uvula surgically absent. Neck: Supple without lymphadenopathy. Lungs: Mildly decreased breath sounds bilaterally without wheezes, rhonchi, or rales appreciated.. CV: Normal S1, S2 without murmurs. Skin: Warm and dry, without lesions or rashes.  The following portions of the patient's history were reviewed and updated as appropriate: allergies, current medications, past family history, past medical history, past social history, past surgical history and problem list.  Review of systems: Constitutional: Negative for fever, chills and weight loss.  HENT: Negative for nosebleeds.  Eyes: Negative for blurred vision.  Respiratory: Negative for hemoptysis.   Cardiovascular: Negative for chest pain.  Gastrointestinal: Negative for diarrhea and constipation.  Genitourinary: Negative for dysuria.  Musculoskeletal: Negative for myalgias and joint pain.  Neurological: Negative for dizziness.  Endo/Heme/Allergies: Does not bruise/bleed easily.   Outpatient medications:    Medication List       This list is accurate as of: 02/01/15  6:59 PM.  Always use your most recent med list.               albuterol (2.5 MG/3ML) 0.083% nebulizer solution  Commonly known as:  PROVENTIL  Take 2.5 mg by nebulization every 6 (six) hours as needed.     albuterol 108 (90 BASE) MCG/ACT inhaler  Commonly known as:  VENTOLIN HFA  Inhale 2 puffs into the lungs every 4 (four) hours as needed for wheezing or shortness of breath (cough).     AMBIEN 10 MG tablet  Generic drug:  zolpidem  Take 10 mg by mouth at bedtime as needed.     buPROPion 300 MG 24 hr tablet  Commonly known as:  WELLBUTRIN XL  Take 300 mg by mouth daily.     buPROPion 150 MG 24 hr tablet  Commonly known as:  WELLBUTRIN XL  Take 150 mg by mouth daily.     EPINEPHrine 0.3 mg/0.3 mL Soaj injection  Commonly known as:  EPI-PEN  Inject 0.3 mLs (0.3 mg total) into the muscle once.     EPIPEN 2-PAK 0.3 mg/0.3 mL Soaj injection  Generic drug:  EPINEPHrine  Inject 0.3 mg into the muscle once.     Estradiol 0.25 MG/0.25GM Gel  Place 1 Syringe onto the skin daily.     estradiol 0.05 MG/24HR patch  Commonly known as:  VIVELLE-DOT  Place 1 patch onto the skin 2 (two) times a week.     INTEGRA PLUS Caps  Take 1 capsule by mouth daily.     levothyroxine 25 MCG tablet  Commonly known as:  SYNTHROID, LEVOTHROID  Take 25 mcg by mouth daily.     Linaclotide 290 MCG Caps capsule  Commonly known as:  LINZESS  Take 1 capsule by mouth daily.     mometasone-formoterol 200-5 MCG/ACT Aero  Commonly known as:  DULERA  Inhale 2 puffs into the lungs 2 (two) times daily. To prevent cough or wheeze.  Rinse, gargle and spit with water after use.     mometasone-formoterol 200-5 MCG/ACT Aero  Commonly known as:  DULERA  Inhale 2 puffs into the lungs 2 (two) times daily.     mometasone-formoterol 200-5 MCG/ACT Aero  Commonly known as:  DULERA  Inhale 2 puffs into the lungs 2 (two) times daily.      progesterone 100 MG vaginal insert  Commonly known as:  ENDOMETRIN  Place 100 mg vaginally daily.     testosterone 50 MG/5GM (1%) Gel  Commonly known as:  ANDROGEL  Place 5 g onto the skin daily.        Known medication allergies: Allergies  Allergen Reactions  . Other Shortness Of Breath and Cough    BEE STING  . Codeine     I appreciate the opportunity to take part in this Casee's care. Please do not hesitate to contact me with questions.  Sincerely,   R. Jorene Guest, MD

## 2015-03-01 ENCOUNTER — Ambulatory Visit (INDEPENDENT_AMBULATORY_CARE_PROVIDER_SITE_OTHER): Payer: 59 | Admitting: Neurology

## 2015-03-01 DIAGNOSIS — J45901 Unspecified asthma with (acute) exacerbation: Secondary | ICD-10-CM

## 2015-03-30 ENCOUNTER — Ambulatory Visit (INDEPENDENT_AMBULATORY_CARE_PROVIDER_SITE_OTHER): Payer: 59

## 2015-03-30 DIAGNOSIS — J4540 Moderate persistent asthma, uncomplicated: Secondary | ICD-10-CM

## 2015-05-23 ENCOUNTER — Ambulatory Visit (INDEPENDENT_AMBULATORY_CARE_PROVIDER_SITE_OTHER): Payer: 59

## 2015-05-23 DIAGNOSIS — J4540 Moderate persistent asthma, uncomplicated: Secondary | ICD-10-CM

## 2015-06-27 ENCOUNTER — Ambulatory Visit (INDEPENDENT_AMBULATORY_CARE_PROVIDER_SITE_OTHER): Payer: 59

## 2015-06-27 DIAGNOSIS — J4540 Moderate persistent asthma, uncomplicated: Secondary | ICD-10-CM

## 2015-07-17 ENCOUNTER — Telehealth: Payer: Self-pay | Admitting: Allergy and Immunology

## 2015-07-17 NOTE — Telephone Encounter (Signed)
I WILL TALK TO TAMMY ABOUT HER ACC'T

## 2015-07-17 NOTE — Telephone Encounter (Signed)
Received a statement and it's saying that she is past due for her injections and she does not know why. Pls call back.

## 2015-07-25 NOTE — Telephone Encounter (Signed)
xolaire problems -I'm working with Tammy to figure out what to do

## 2015-09-19 ENCOUNTER — Ambulatory Visit (INDEPENDENT_AMBULATORY_CARE_PROVIDER_SITE_OTHER): Payer: 59 | Admitting: *Deleted

## 2015-09-19 DIAGNOSIS — J4540 Moderate persistent asthma, uncomplicated: Secondary | ICD-10-CM

## 2015-11-24 ENCOUNTER — Ambulatory Visit (INDEPENDENT_AMBULATORY_CARE_PROVIDER_SITE_OTHER): Payer: 59

## 2015-11-24 DIAGNOSIS — J4540 Moderate persistent asthma, uncomplicated: Secondary | ICD-10-CM

## 2015-12-22 ENCOUNTER — Ambulatory Visit: Payer: 59

## 2016-02-23 ENCOUNTER — Ambulatory Visit (INDEPENDENT_AMBULATORY_CARE_PROVIDER_SITE_OTHER): Payer: 59

## 2016-02-23 DIAGNOSIS — J4540 Moderate persistent asthma, uncomplicated: Secondary | ICD-10-CM | POA: Diagnosis not present

## 2016-03-21 ENCOUNTER — Ambulatory Visit: Payer: 59

## 2016-03-21 ENCOUNTER — Ambulatory Visit (INDEPENDENT_AMBULATORY_CARE_PROVIDER_SITE_OTHER): Payer: 59

## 2016-03-21 DIAGNOSIS — J4540 Moderate persistent asthma, uncomplicated: Secondary | ICD-10-CM | POA: Diagnosis not present

## 2016-04-17 ENCOUNTER — Ambulatory Visit: Payer: 59

## 2016-04-18 ENCOUNTER — Ambulatory Visit (INDEPENDENT_AMBULATORY_CARE_PROVIDER_SITE_OTHER): Payer: 59 | Admitting: *Deleted

## 2016-04-18 DIAGNOSIS — J4540 Moderate persistent asthma, uncomplicated: Secondary | ICD-10-CM

## 2016-05-15 ENCOUNTER — Ambulatory Visit: Payer: 59

## 2016-05-16 ENCOUNTER — Ambulatory Visit (INDEPENDENT_AMBULATORY_CARE_PROVIDER_SITE_OTHER): Payer: 59

## 2016-05-16 ENCOUNTER — Ambulatory Visit: Payer: Self-pay

## 2016-05-16 DIAGNOSIS — J4540 Moderate persistent asthma, uncomplicated: Secondary | ICD-10-CM | POA: Diagnosis not present

## 2016-06-12 ENCOUNTER — Ambulatory Visit: Payer: 59

## 2016-07-23 ENCOUNTER — Ambulatory Visit (INDEPENDENT_AMBULATORY_CARE_PROVIDER_SITE_OTHER): Payer: 59 | Admitting: *Deleted

## 2016-07-23 DIAGNOSIS — J4540 Moderate persistent asthma, uncomplicated: Secondary | ICD-10-CM

## 2016-08-26 ENCOUNTER — Ambulatory Visit (INDEPENDENT_AMBULATORY_CARE_PROVIDER_SITE_OTHER): Payer: 59

## 2016-08-26 DIAGNOSIS — J4540 Moderate persistent asthma, uncomplicated: Secondary | ICD-10-CM | POA: Diagnosis not present

## 2016-09-23 ENCOUNTER — Ambulatory Visit: Payer: Self-pay

## 2016-09-23 ENCOUNTER — Ambulatory Visit (INDEPENDENT_AMBULATORY_CARE_PROVIDER_SITE_OTHER): Payer: 59

## 2016-09-23 DIAGNOSIS — J4540 Moderate persistent asthma, uncomplicated: Secondary | ICD-10-CM

## 2016-10-10 ENCOUNTER — Other Ambulatory Visit: Payer: Self-pay | Admitting: *Deleted

## 2016-10-10 MED ORDER — OMALIZUMAB 150 MG ~~LOC~~ SOLR: 150.0000 mg | SUBCUTANEOUS | 11 refills | Status: DC

## 2016-10-21 ENCOUNTER — Ambulatory Visit: Payer: Self-pay

## 2016-10-25 ENCOUNTER — Ambulatory Visit (INDEPENDENT_AMBULATORY_CARE_PROVIDER_SITE_OTHER): Payer: 59

## 2016-10-25 ENCOUNTER — Ambulatory Visit (INDEPENDENT_AMBULATORY_CARE_PROVIDER_SITE_OTHER): Payer: 59 | Admitting: Orthopaedic Surgery

## 2016-10-25 ENCOUNTER — Encounter (INDEPENDENT_AMBULATORY_CARE_PROVIDER_SITE_OTHER): Payer: Self-pay | Admitting: Orthopaedic Surgery

## 2016-10-25 VITALS — BP 102/72 | HR 78 | Ht 63.0 in | Wt 146.0 lb

## 2016-10-25 DIAGNOSIS — M7542 Impingement syndrome of left shoulder: Secondary | ICD-10-CM | POA: Diagnosis not present

## 2016-10-25 DIAGNOSIS — M25512 Pain in left shoulder: Secondary | ICD-10-CM

## 2016-10-25 MED ORDER — METHYLPREDNISOLONE ACETATE 40 MG/ML IJ SUSP
40.0000 mg | INTRAMUSCULAR | Status: AC | PRN
  Administered 2016-10-25: 40 mg via INTRA_ARTICULAR

## 2016-10-25 MED ORDER — LIDOCAINE HCL 1 % IJ SOLN
1.0000 mL | INTRAMUSCULAR | Status: AC | PRN
  Administered 2016-10-25: 1 mL

## 2016-10-25 MED ORDER — BUPIVACAINE HCL 0.25 % IJ SOLN
4.0000 mL | INTRAMUSCULAR | Status: AC | PRN
  Administered 2016-10-25: 4 mL via INTRA_ARTICULAR

## 2016-10-25 NOTE — Progress Notes (Signed)
Office Visit Note   Patient: Kelsey Decker           Date of Birth: 1960/10/07           MRN: [id number] Visit Date: 10/25/2016              Requested by: Merri Brunette, MD [phone] [address] [place], [place] [address] PCP: Merri Brunette, MD   Assessment & Plan: Visit Diagnoses:  1. Acute pain of left shoulder     Plan: Left subacromial injection . She got about 50% improvement from the injection still has some discomfort with supraspinatus resisted testing. We'll see how she does over the next few weeks and she is having persistent problems 3 weeks she will call me know we can proceed with diagnostic imaging to rule out small supraspinatus tear.  Follow-Up Instructions: No Follow-up on file.   Orders:  Orders Placed This Encounter  Procedures  . XR Shoulder Left   No orders of the defined types were placed in this encounter.     Procedures: Large Joint Inj Date/Time: 10/25/2016 1:55 PM Performed by: Eldred Manges Authorized by: Annell Greening C   Consent Given by:  Patient Indications:  Pain Location:  Shoulder Site:  L subacromial bursa Needle Size:  22 G Needle Length:  1.5 inches Ultrasound Guidance: No   Fluoroscopic Guidance: No   Arthrogram: No   Medications:  1 mL lidocaine 1 %; 40 mg methylPREDNISolone acetate 40 MG/ML; 4 mL bupivacaine 0.25 % Aspiration Attempted: No   Patient tolerance:  Patient tolerated the procedure well with no immediate complications     Clinical Data: No additional findings.   Subjective: Chief Complaint  Patient presents with  . Left Shoulder - Pain    HPI 56 year old female with pain in her left shoulder for several months. She had previous rotator cuff repair by me about 10 years ago in the right shoulder is done well. Left shoulder is been progressively painful she thinks she might of injured it when she was doing shoulder fly exercises. She has trouble washing her hair reaching out in front of her. She  can get her arm overhead if she goes in flexion but not with abduction. Pain reaching past the posterior axillary line. Pain is lateral she has some pain anterior but not as severe. Previous opposite right shoulder had biceps tenodesis as well as supraspinatus repair. She's had no problems with the right shoulder.she has been working out at [employer] doing shoulder exercises with a physical theist who teaches the Pilates class without improvement in her shoulder for the last 6 weeks.  Review of Systems4 point review of systems is updatedERD, esophageal stricture, asthma previous left shouer rotator cuff repair. Hiatal hernia. Negative for stroke or MI.   Objective: Vital Signs: BP 102/72   Pulse 78   Ht 5\' 3"  (1.6 m)   Wt 146 lb (66.2 kg)   BMI 25.86 kg/m   Physical Exam  Constitutional: She is oriented to person, place, and time. She appears well-developed.  HENT:  Head: Normocephalic.  Right Ear: External ear normal.  Left Ear: External ear normal.  Eyes: Pupils are equal, round, and reactive to light.  Neck: No tracheal deviation present. No thyromegaly present.  Cardiovascular: Normal rate.   Pulmonary/Chest: Effort normal.  Abdominal: Soft.  Musculoskeletal:  No impingement right shoulder. Well-healed transverse incision top of the shoulder from rotator cuff repair and biceps tenodesis. Biceps is well located good range of  motion of the right shoulder. Left shoulder shows an with resisted abduction positive impingement long head of the biceps is mild to moderately tender. No subluxation of the shoulder negative apprehension. She has pain with external rotation and can reach posteriorly to only the posterior axillary line. No biceps atrophy no periscapular atrophy. Nodes peripheral nerve entrapment extremities. Skin is normal. Normal heel toe gait. No supraclavicular lymphadenopathy no brachial plexus tenderness.  Neurological: She is alert and oriented to person, place, and time.  Skin:  Skin is warm and dry.  Psychiatric: She has a normal mood and affect. Her behavior is normal.    Ortho Exam  Specialty Comments:  No specialty comments available.  Imaging: No results found.   PMFS History: Patient Active Problem List   Diagnosis Date Noted  . Asthma with acute exacerbation 02/01/2015  . Severe persistent asthma 12/24/2014  . Perennial allergic rhinitis 12/24/2014  . Bee sting reaction 12/24/2014  . SLOW TRANSIT CONSTIPATION 05/09/2008  . ESOPHAGEAL STRICTURE 05/05/2008  . GERD 05/05/2008  . HIATAL HERNIA 05/05/2008  . ESOPHAGITIS, HX OF 05/05/2008   Past Medical History:  Diagnosis Date  . Asthma   . Erosive esophagitis   . GERD (gastroesophageal reflux disease)   . Hiatal hernia   . Hypothyroidism   . Iron deficiency     Family History  Problem Relation Age of Onset  . Colon cancer Mother [id number]       deceased    Past Surgical History:  Procedure Laterality Date  . APPENDECTOMY    . CESAREAN SECTION    . PATENT FORAMEN OVALE CLOSURE    . TONSILLECTOMY AND ADENOIDECTOMY     Social History   Occupational History  . Restaurant manager, fast foodresident of [employer]    Social History Main Topics  . Smoking status: Never Smoker  . Smokeless tobacco: Never Used  . Alcohol use Yes     Comment: rarely  . Drug use: No  . Sexual activity: Not on file

## 2016-10-25 NOTE — Addendum Note (Signed)
Addended by: Penne LashSHUE WILLS, Neysa BonitoHRISTY N on: 10/25/2016 02:18 PM   Modules accepted: Orders

## 2017-01-02 ENCOUNTER — Ambulatory Visit (INDEPENDENT_AMBULATORY_CARE_PROVIDER_SITE_OTHER): Payer: 59 | Admitting: *Deleted

## 2017-01-02 DIAGNOSIS — J4540 Moderate persistent asthma, uncomplicated: Secondary | ICD-10-CM | POA: Diagnosis not present

## 2017-01-29 ENCOUNTER — Ambulatory Visit: Payer: 59

## 2017-01-30 ENCOUNTER — Ambulatory Visit (INDEPENDENT_AMBULATORY_CARE_PROVIDER_SITE_OTHER): Payer: 59

## 2017-01-30 DIAGNOSIS — J4540 Moderate persistent asthma, uncomplicated: Secondary | ICD-10-CM
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
## 2017-02-27 ENCOUNTER — Ambulatory Visit: Payer: Self-pay

## 2017-03-09 ENCOUNTER — Other Ambulatory Visit: Payer: Self-pay

## 2017-03-09 ENCOUNTER — Emergency Department (HOSPITAL_BASED_OUTPATIENT_CLINIC_OR_DEPARTMENT_OTHER)
Admission: EM | Admit: 2017-03-09 | Discharge: 2017-03-09 | Disposition: A | Discharge status: Home / Self Care | Payer: 59 | Attending: Emergency Medicine | Admitting: Emergency Medicine

## 2017-03-09 ENCOUNTER — Encounter (HOSPITAL_BASED_OUTPATIENT_CLINIC_OR_DEPARTMENT_OTHER): Payer: Self-pay | Admitting: Emergency Medicine

## 2017-03-09 DIAGNOSIS — M79642 Pain in left hand: Secondary | ICD-10-CM | POA: Diagnosis present

## 2017-03-09 DIAGNOSIS — W57XXXA Bitten or stung by nonvenomous insect and other nonvenomous arthropods, initial encounter: Secondary | ICD-10-CM | POA: Insufficient documentation

## 2017-03-09 DIAGNOSIS — Z79899 Other long term (current) drug therapy: Secondary | ICD-10-CM | POA: Insufficient documentation

## 2017-03-09 DIAGNOSIS — Z23 Encounter for immunization: Secondary | ICD-10-CM | POA: Diagnosis not present

## 2017-03-09 DIAGNOSIS — J45909 Unspecified asthma, uncomplicated: Secondary | ICD-10-CM | POA: Insufficient documentation

## 2017-03-09 DIAGNOSIS — E039 Hypothyroidism, unspecified: Secondary | ICD-10-CM | POA: Insufficient documentation

## 2017-03-09 MED ORDER — BACITRACIN ZINC 500 UNIT/GM EX OINT
TOPICAL_OINTMENT | Freq: Once | CUTANEOUS | Status: AC
  Administered 2017-03-09: 18:00:00 via TOPICAL
  Filled 2017-03-09: qty 28.35

## 2017-03-09 MED ORDER — LIDOCAINE-EPINEPHRINE-TETRACAINE (LET) SOLUTION
3.0000 mL | Freq: Once | NASAL | Status: AC
  Administered 2017-03-09: 3 mL via TOPICAL
  Filled 2017-03-09: qty 3

## 2017-03-09 MED ORDER — TETANUS-DIPHTH-ACELL PERTUSSIS 5-2.5-18.5 LF-MCG/0.5 IM SUSP
0.5000 mL | Freq: Once | INTRAMUSCULAR | Status: AC
  Administered 2017-03-09: 0.5 mL via INTRAMUSCULAR
  Filled 2017-03-09: qty 0.5

## 2017-03-09 MED ORDER — SULFAMETHOXAZOLE-TRIMETHOPRIM 800-160 MG PO TABS
1.0000 | ORAL_TABLET | Freq: Once | ORAL | Status: AC
  Administered 2017-03-09: 1 via ORAL
  Filled 2017-03-09: qty 1

## 2017-03-09 MED ORDER — SULFAMETHOXAZOLE-TRIMETHOPRIM 800-160 MG PO TABS: 1.0000 | ORAL_TABLET | Freq: Two times a day (BID) | ORAL | 0 refills | Status: AC

## 2017-03-09 NOTE — ED Triage Notes (Signed)
Pt reports possible insect bite to LT hand since Thurs; received Keflex rx Fri; no improvement; also reports redness and swelling to LT cheek today

## 2017-03-09 NOTE — Discharge Instructions (Signed)
As discussed, keep the area clean and dry with sterile dressing. Take entire course of antibiotics even if you feel better.  Follow-up with your primary care provider.  Return sooner if you experience worsening symptoms or new concerning symptoms in the meantime.

## 2017-03-09 NOTE — ED Provider Notes (Signed)
[facility] EMERGENCY DEPARTMENT Provider Note   CSN: [id number] Arrival date & time: 03/09/17  1431     History   Chief Complaint Chief Complaint  Patient presents with  . Insect Bite    HPI Kelsey Decker is a 56 y.o. female presenting with infected insect bite to left hand with surrounding erythema worsening over the last 3 days. She was seen for this and prescribed Keflex Friday but reports that it has been worsening. She also believes that she may be experiencing a reaction from the antibiotic as she has slight erythema to her left cheek and tingling which is something she experiences with bee stings. No difficulty breathing, wheezing, chest pain, facial swelling or compromised airway. No fever, chills, nausea, vomiting, numbness.  HPI  Past Medical History:  Diagnosis Date  . Asthma   . Erosive esophagitis   . GERD (gastroesophageal reflux disease)   . Hiatal hernia   . Hypothyroidism   . Iron deficiency     Patient Active Problem List   Diagnosis Date Noted  . Impingement syndrome of left shoulder 10/25/2016  . Asthma with acute exacerbation 02/01/2015  . Severe persistent asthma 12/24/2014  . Perennial allergic rhinitis 12/24/2014  . Bee sting reaction 12/24/2014  . SLOW TRANSIT CONSTIPATION 05/09/2008  . ESOPHAGEAL STRICTURE 05/05/2008  . GERD 05/05/2008  . HIATAL HERNIA 05/05/2008  . ESOPHAGITIS, HX OF 05/05/2008    Past Surgical History:  Procedure Laterality Date  . APPENDECTOMY    . CESAREAN SECTION    . PATENT FORAMEN OVALE CLOSURE    . TONSILLECTOMY AND ADENOIDECTOMY      OB History    No data available       Home Medications    Prior to Admission medications   Medication Sig Start Date End Date Taking? Authorizing Provider  albuterol (PROVENTIL) (2.5 MG/3ML) 0.083% nebulizer solution Take 2.5 mg by nebulization every 6 (six) hours as needed.    [provider]  buPROPion (WELLBUTRIN XL) 300 MG 24 hr tablet Take  300 mg by mouth daily.    [provider]  EPINEPHrine (EPIPEN 2-PAK) 0.3 mg/0.3 mL IJ SOAJ injection Inject 0.3 mg into the muscle once.    [provider]  EPINEPHrine 0.3 mg/0.3 mL IJ SOAJ injection Inject 0.3 mLs (0.3 mg total) into the muscle once. 02/01/15   Bobbitt, Heywood Ilesalph Carter, MD  Estradiol 0.25 MG/0.25GM GEL Place 1 Syringe onto the skin daily.    [provider]  levothyroxine (SYNTHROID, LEVOTHROID) 25 MCG tablet Take 25 mcg by mouth daily.    [provider]  Linaclotide Karlene Einstein(LINZESS) 290 MCG CAPS Take 1 capsule by mouth daily. 09/02/11   Esterwood, Amy S, PA-C  mometasone-formoterol (DULERA) 200-5 MCG/ACT AERO Inhale 2 puffs into the lungs 2 (two) times daily. To prevent cough or wheeze.  Rinse, gargle and spit with water after use. 02/01/15   Bobbitt, Heywood Ilesalph Carter, MD  omalizumab Geoffry Paradise(XOLAIR) 150 MG injection Inject 150 mg into the skin every 28 (twenty-eight) days. 10/10/16   Kozlow, Alvira PhilipsEric J, MD  progesterone (ENDOMETRIN) 100 MG vaginal insert Place 100 mg vaginally daily.    [provider]  sulfamethoxazole-trimethoprim (BACTRIM DS,SEPTRA DS) 800-160 MG tablet Take 1 tablet 2 (two) times daily for 7 days by mouth. 03/09/17 03/16/17  Mathews RobinsonsMitchell, Yoshino Broccoli B, PA-C  zolpidem (AMBIEN) 10 MG tablet Take 10 mg by mouth at bedtime as needed.    [provider]    Family History Family History  Problem  Relation Age of Onset  . Colon cancer Mother 71       deceased    Social History Social History   Tobacco Use  . Smoking status: Never Smoker  . Smokeless tobacco: Never Used  Substance Use Topics  . Alcohol use: Yes    Comment: rarely  . Drug use: No     Allergies   Other and Codeine   Review of Systems Review of Systems  Constitutional: Positive for fatigue. Negative for chills and fever.  HENT: Negative for congestion, facial swelling, sore throat, tinnitus, trouble swallowing and voice change.   Respiratory: Negative for  cough, choking, chest tightness, shortness of breath, wheezing and stridor.   Cardiovascular: Negative for chest pain and palpitations.  Gastrointestinal: Negative for nausea and vomiting.  Musculoskeletal: Negative for myalgias, neck pain and neck stiffness.  Skin: Positive for color change and wound.  Neurological: Negative for dizziness, syncope, weakness, light-headedness and numbness.     Physical Exam Updated Vital Signs BP 105/75 (BP Location: Right Arm)   Pulse 60   Temp 98 F (36.7 C) (Oral)   Resp 18   Ht 5\' 3"  (1.6 m)   Wt 65.3 kg (144 lb)   SpO2 100%   BMI 25.51 kg/m   Physical Exam  Constitutional: She appears well-developed and well-nourished. No distress.  Afebrile, nontoxic-appearing, sitting comfortably in bed in no acute distress.  HENT:  Head: Normocephalic and atraumatic.  Eyes: Conjunctivae and EOM are normal.  Neck: Normal range of motion. Neck supple.  Cardiovascular: Normal rate, regular rhythm, normal heart sounds and intact distal pulses.  No murmur heard. Pulmonary/Chest: Effort normal and breath sounds normal. No stridor. No respiratory distress. She has no wheezes. She has no rales.  Musculoskeletal: Normal range of motion. She exhibits tenderness. She exhibits no edema.  Full range of motion of the hand, 5/5 strength to grips bilaterally, ended to palpation surrounding area, no edema or pain with palpation of the joints.  Neurological: She is alert. No sensory deficit.  Neurovascularly intact  Skin: Skin is warm and dry. Capillary refill takes less than 2 seconds. No rash noted. She is not diaphoretic. There is erythema. No pallor.  Psychiatric: She has a normal mood and affect.  Nursing note and vitals reviewed.      ED Treatments / Results  Labs (all labs ordered are listed, but only abnormal results are displayed) Labs Reviewed - No data to display  EKG  EKG Interpretation None       Radiology No results  found.  Procedures Procedures (including critical care time) INCISION AND DRAINAGE Performed by: Georgiana Shore Consent: Verbal consent obtained. Risks and benefits: risks, benefits and alternatives were discussed Type: abscess  Body area: left hand  Anesthesia: local infiltration  Incision was made with an 18 gauge needle  Local anesthetic:LET  Anesthetic total: 4 ml  Complexity: simple  Drainage: purulent  Drainage amount: 0.25 mL  Packing material:n/a  Patient tolerance: Patient tolerated the procedure well with no immediate complications.    Medications Ordered in ED Medications  lidocaine-EPINEPHrine-tetracaine (LET) solution (3 mLs Topical Given 03/09/17 1713)  Tdap (BOOSTRIX) injection 0.5 mL (0.5 mLs Intramuscular Given 03/09/17 1710)  sulfamethoxazole-trimethoprim (BACTRIM DS,SEPTRA DS) 800-160 MG per tablet 1 tablet (1 tablet Oral Given 03/09/17 1819)  bacitracin ointment ( Topical Given 03/09/17 1820)     Initial Impression / Assessment and Plan / ED Course  I have reviewed the triage vital signs and the nursing notes.  Pertinent labs & imaging results that were available during my care of the patient were reviewed by me and considered in my medical decision making (see chart for details).    Patient presenting with infected possible insect bite sustained while crawling in a basement.  Performed small I&D with 18-gauge needle which expressed small amount of purulence and patient experienced relief. Advised warm compresses and bacitracin and to discontinue Keflex and start Bactrim for staph coverage.  Discharge home with antibiotics and close follow-up with PCP. Patient is otherwise well-appearing, nontoxic afebrile.  Discussed strict return precautions and advised to return to the emergency department if experiencing any new or worsening symptoms. Instructions were understood and patient agreed with discharge plan. Final Clinical Impressions(s) /  ED Diagnoses   Final diagnoses:  Bug bite with infection, initial encounter    ED Discharge Orders        Ordered    sulfamethoxazole-trimethoprim (BACTRIM DS,SEPTRA DS) 800-160 MG tablet  2 times daily     03/09/17 1804       Gregary CromerMitchell, Miah Boye B, PA-C 03/09/17 2214    Rolland PorterJames, Mark, MD 03/25/17 [phone]

## 2017-03-09 NOTE — ED Notes (Signed)
Pt given rx for Septra. Wound care discussed

## 2017-03-09 NOTE — ED Notes (Signed)
ED Provider at bedside. EDPA Clovis RileyMitchell

## 2017-05-12 ENCOUNTER — Encounter: Payer: Self-pay | Admitting: *Deleted

## 2017-12-01 ENCOUNTER — Telehealth: Payer: Self-pay

## 2017-12-01 NOTE — Telephone Encounter (Signed)
[facility]; appt with Dr. Mayford Knifeurner

## 2018-01-20 ENCOUNTER — Ambulatory Visit: Payer: Self-pay | Admitting: Cardiology

## 2018-01-29 ENCOUNTER — Ambulatory Visit: Payer: 59 | Admitting: Allergy & Immunology

## 2018-01-29 ENCOUNTER — Encounter: Payer: Self-pay | Admitting: Allergy & Immunology

## 2018-01-29 VITALS — BP 110/70 | HR 67 | Resp 17 | Ht 62.0 in | Wt 150.6 lb

## 2018-01-29 DIAGNOSIS — J4541 Moderate persistent asthma with (acute) exacerbation: Secondary | ICD-10-CM

## 2018-01-29 MED ORDER — IPRATROPIUM-ALBUTEROL 0.5-2.5 (3) MG/3ML IN SOLN: 3.0000 mL | RESPIRATORY_TRACT | 1 refills | Status: DC | PRN

## 2018-01-29 MED ORDER — ALBUTEROL SULFATE HFA 108 (90 BASE) MCG/ACT IN AERS: 2.0000 | INHALATION_SPRAY | RESPIRATORY_TRACT | 5 refills | Status: DC | PRN

## 2018-01-29 MED ORDER — FLUTICASONE FUROATE-VILANTEROL 200-25 MCG/INH IN AEPB: 1.0000 | INHALATION_SPRAY | Freq: Every day | RESPIRATORY_TRACT | 5 refills | Status: DC

## 2018-01-29 MED ORDER — METHYLPREDNISOLONE ACETATE 80 MG/ML IJ SUSP
80.0000 mg | Freq: Once | INTRAMUSCULAR | Status: AC
  Administered 2018-01-29: 80 mg via INTRAMUSCULAR

## 2018-01-29 NOTE — Patient Instructions (Addendum)
1. Moderate persistent asthma, uncomplicated - Lung testing looks terrible today. - We will get the paperwork started for restarting Xolair.  - In the meantime, start Breo 200/5 one puff once daily. - We can take this off once they Xolair was restarted.  - Steroid injection give today.  - Daily controller medication(s): Breo 200/20mcg one puff once daily - Prior to physical activity: ProAir 2 puffs 10-15 minutes before physical activity. - Rescue medications: ProAir 4 puffs every 4-6 hours as needed or DuoNeb nebulizer one vial every 4-6 hours as needed - Asthma control goals:  * Full participation in all desired activities (may need albuterol before activity) * Albuterol use two time or less a week on average (not counting use with activity) * Cough interfering with sleep two time or less a month * Oral steroids no more than once a year * No hospitalizations  2. Return in about 4 weeks (around 02/26/2018).   Please inform us of any Emergency Department visits, hospitalizations, or changes in symptoms. Call us before going to the ED for breathing or allergy symptoms since we might be able to fit you in for a sick visit. Feel free to contact us anytime with any questions, problems, or concerns.  It was a pleasure to meet you today!  Websites that have reliable patient information: 1. American Academy of Asthma, Allergy, and Immunology: www.aaaai.org 2. Food Allergy Research and Education (FARE): foodallergy.org 3. Mothers of Asthmatics: http://www.asthmacommunitynetwork.org 4. American College of Allergy, Asthma, and Immunology: MissingWeapons.ca   Make sure you are registered to vote! If you have moved or changed any of your contact information, you will need to get this updated before voting!

## 2018-01-29 NOTE — Progress Notes (Signed)
FOLLOW UP  Date of Service/Encounter:  01/29/18   Assessment:   Moderate persistent asthma with acute exacerbation   Kelsey Decker is a 57 year old female with a history of moderate persistent asthma.  She had previously been very well controlled on Xolair, but since having to stop it her symptoms have worsened.  She remains on Breo 200/5 mcg 1 puff once daily, but since she did not have insurance she was unable to restart the Xolair.  Her insurance situation is now changed and is much more solid, so we will go ahead and restart the Xolair.    Plan/Recommendations:   1. Moderate persistent asthma, uncomplicated - Lung testing looks terrible today. - We will get the paperwork started for restarting Xolair.  - In the meantime, start Breo 200/5 one puff once daily. - We can take this off once they Xolair was restarted.  - Steroid injection give today.  - Daily controller medication(s): Breo 200/32mcg one puff once daily - Prior to physical activity: ProAir 2 puffs 10-15 minutes before physical activity. - Rescue medications: ProAir 4 puffs every 4-6 hours as needed or DuoNeb nebulizer one vial every 4-6 hours as needed - Asthma control goals:  * Full participation in all desired activities (may need albuterol before activity) * Albuterol use two time or less a week on average (not counting use with activity) * Cough interfering with sleep two time or less a month * Oral steroids no more than once a year * No hospitalizations  2. Return in about 4 weeks (around 02/26/2018).  Subjective:   Kelsey Decker is a 57 y.o. female presenting today for follow up of  Chief Complaint  Patient presents with  . Asthma  . Immunotherapy  . Cough    Kelsey Decker has a history of the following: Patient Active Problem List   Diagnosis Date Noted  . Impingement syndrome of left shoulder 10/25/2016  . Asthma with acute exacerbation 02/01/2015  . Severe persistent asthma  12/24/2014  . Perennial allergic rhinitis 12/24/2014  . Bee sting reaction 12/24/2014  . SLOW TRANSIT CONSTIPATION 05/09/2008  . ESOPHAGEAL STRICTURE 05/05/2008  . GERD 05/05/2008  . HIATAL HERNIA 05/05/2008  . ESOPHAGITIS, HX OF 05/05/2008    History obtained from: chart review and patient.  Kelsey Decker's Primary Care Provider is Merri Brunette, MD.     Kelsey Decker is a 57 y.o. female presenting for a sick visit.  She was last seen in October 2016 by Kelsey Decker.  At that time, she was diagnosed with an asthma exacerbation.  She was treated with a prednisone burst.  She was also started on Dulera 200/5 mcg 2 puffs twice daily. She was getting Xolair monthly since October 2018. 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   Since the last visit, she has mostly done well. She did have bronchitis on Labor Day. She was started on a steroid and an antibiotic. Nothing seems to have helped. She has gotten 2-3 rounds of prednisone since stopping the Xolair. She is interested in restarting the Xolair since her insurance status has become more solidified.   In the interim, she has had a problem with worsening SOB and coughing as well as wheezing. She has had no fever and has surprisingly been able to maintain her active lifestyle. She denies GERD as well as allergic rhinitis, but review of her chart after her visit  shows that she was on allergen immunotherapy in the past.    Otherwise, there have been no changes to her past medical history, surgical history, family history, or social history.    Review of Systems: a 14-point review of systems is pertinent for what is mentioned in HPI.  Otherwise, all other systems were negative.  Constitutional: negative other than that listed in the HPI Eyes: negative other than that listed in the HPI Ears, nose, mouth, throat, and face: negative other than that listed  in the HPI Respiratory: negative other than that listed in the HPI Cardiovascular: negative other than that listed in the HPI Gastrointestinal: negative other than that listed in the HPI Genitourinary: negative other than that listed in the HPI Integument: negative other than that listed in the HPI Hematologic: negative other than that listed in the HPI Musculoskeletal: negative other than that listed in the HPI Neurological: negative other than that listed in the HPI Allergy/Immunologic: negative other than that listed in the HPI    Objective:   Blood pressure 110/70, pulse 67, resp. rate 17, height 5\' 2"  (1.575 m), weight 150 lb 9.6 oz (68.3 kg), SpO2 93 %. Body mass index is 27.55 kg/m.   Physical Exam:  General: Alert, interactive, in mild distress. Eyes: No conjunctival injection bilaterally, no discharge on the right and no discharge on the left. PERRL bilaterally. EOMI without pain. No photophobia.  Ears: Right TM pearly gray with normal light reflex, Left TM pearly gray with normal light reflex, Right TM intact without perforation and Left TM intact without perforation.  Nose/Throat: External nose within normal limits and septum midline. Turbinates edematous with clear discharge. Posterior oropharynx erythematous without cobblestoning in the posterior oropharynx. Tonsils 2+ without exudates.  Tongue without thrush. Lungs: Decreased breath sounds bilaterally without wheezing, rhonchi or rales. Increased work of breathing. CV: Normal S1/S2. No murmurs. Capillary refill <2 seconds.  Skin: Warm and dry, without lesions or rashes. Neuro:   Grossly intact. No focal deficits appreciated. Responsive to questions.  Diagnostic studies:   Spirometry: results abnormal (FEV1: 1.02/44%, FVC: 1.21/41%, FEV1/FVC: 84%).    Spirometry consistent with possible restrictive disease. Xopenex/Atrovent nebulizer treatment given in clinic.  Allergy Studies: none    Malachi Bonds, MD  [facility]

## 2018-02-01 LAB — CBC WITH DIFFERENTIAL/PLATELET
BASOS ABS: 0.1 10*3/uL (ref 0.0–0.2)
Basos: 1 %
EOS (ABSOLUTE): 0.6 10*3/uL — ABNORMAL HIGH (ref 0.0–0.4)
Eos: 7 %
HEMOGLOBIN: 14.6 g/dL (ref 11.1–15.9)
Hematocrit: 43.8 % (ref 34.0–46.6)
IMMATURE GRANS (ABS): 0 10*3/uL (ref 0.0–0.1)
IMMATURE GRANULOCYTES: 0 %
LYMPHS: 32 %
Lymphocytes Absolute: 2.7 10*3/uL (ref 0.7–3.1)
MCH: 29.7 pg (ref 26.6–33.0)
MCHC: 33.3 g/dL (ref 31.5–35.7)
MCV: 89 fL (ref 79–97)
MONOCYTES: 10 %
Monocytes Absolute: 0.8 10*3/uL (ref 0.1–0.9)
NEUTROS ABS: 4.3 10*3/uL (ref 1.4–7.0)
NEUTROS PCT: 50 %
Platelets: 259 10*3/uL (ref 150–450)
RBC: 4.91 x10E6/uL (ref 3.77–5.28)
RDW: 13.5 % (ref 12.3–15.4)
WBC: 8.5 10*3/uL (ref 3.4–10.8)

## 2018-02-01 LAB — IGE+ALLERGENS ZONE 2(30)
Alternaria Alternata IgE: <0.1 kU/L
Bermuda Grass IgE: <0.1 kU/L
Cedar, Mountain IgE: <0.1 kU/L
Common Silver Birch IgE: <0.1 kU/L
Dog Dander IgE: 1.32 kU/L — AB
E001-IGE CAT DANDER: 1.78 kU/L — AB
Hickory, White IgE: <0.1 kU/L
IgE (Immunoglobulin E), Serum: 42 IU/mL (ref 6–495)
Johnson Grass IgE: <0.1 kU/L
Mucor Racemosus IgE: <0.1 kU/L
Mugwort IgE Qn: <0.1 kU/L
Nettle IgE: <0.1 kU/L
Oak, White IgE: <0.1 kU/L
Plantain, English IgE: <0.1 kU/L
Ragweed, Short IgE: <0.1 kU/L
Stemphylium Herbarum IgE: <0.1 kU/L
Sweet gum IgE RAST Ql: <0.1 kU/L
Timothy Grass IgE: <0.1 kU/L

## 2018-02-03 ENCOUNTER — Encounter: Payer: Self-pay | Admitting: Allergy & Immunology

## 2018-02-17 ENCOUNTER — Other Ambulatory Visit: Payer: Self-pay | Admitting: *Deleted

## 2018-02-17 MED ORDER — EPINEPHRINE 0.3 MG/0.3ML IJ SOAJ: 0.3000 mg | Freq: Once | INTRAMUSCULAR | 1 refills | Status: AC

## 2018-02-18 ENCOUNTER — Ambulatory Visit (INDEPENDENT_AMBULATORY_CARE_PROVIDER_SITE_OTHER): Payer: Managed Care, Other (non HMO) | Admitting: *Deleted

## 2018-02-18 DIAGNOSIS — J4540 Moderate persistent asthma, uncomplicated: Secondary | ICD-10-CM

## 2018-02-18 DIAGNOSIS — J4541 Moderate persistent asthma with (acute) exacerbation: Secondary | ICD-10-CM

## 2018-02-19 NOTE — Progress Notes (Signed)
Immunotherapy   Patient Details  Name: Kelsey Decker MRN: [id number] Date of Birth: 1961-01-19  02/19/2018  [name] started injections for  Xolair 150mg   Frequency:Every 4 weeks Epi-Pen:Epi-Pen Available  Consent signed and patient instructions given. Patient waited 30 min without no problems    [name] 02/19/2018, 9:04 AM

## 2018-02-20 ENCOUNTER — Encounter: Payer: Self-pay | Admitting: Plastic Surgery

## 2018-02-20 ENCOUNTER — Telehealth: Payer: Self-pay | Admitting: Plastic Surgery

## 2018-02-20 ENCOUNTER — Ambulatory Visit (INDEPENDENT_AMBULATORY_CARE_PROVIDER_SITE_OTHER): Payer: Self-pay | Admitting: Plastic Surgery

## 2018-02-20 DIAGNOSIS — Z719 Counseling, unspecified: Secondary | ICD-10-CM

## 2018-02-20 NOTE — Telephone Encounter (Signed)
Patient requesting call from [place] when laser is installed

## 2018-02-20 NOTE — Progress Notes (Signed)
Botulinum Toxin Procedure Note  Procedure: Cosmetic botulinum toxin   Pre-operative Diagnosis: Dynamic rhytides  Post-operative Diagnosis: Same  Complications:  None  Brief history: The patient desires botulinum toxin injection of her forehead. I discussed with the patient this proposed procedure of botulinum toxin injections, which is customized depending on the particular needs of the patient. It is performed on facial rhytids as a temporary correction. The alternatives were discussed with the patient. The risks were addressed including bleeding, scarring, infection, damage to deeper structures, asymmetry, and chronic pain, which may occur infrequently after a procedure. The individual's choice to undergo a surgical procedure is based on the comparison of risks to potential benefits. Other risks include unsatisfactory results, brow ptosis, eyelid ptosis, allergic reaction, temporary paralysis, which should go away with time, bruising, blurring disturbances and delayed healing. Botulinum toxin injections do not arrest the aging process or produce permanent tightening of the eyelid.  Operative intervention maybe necessary to maintain the results of a blepharoplasty or botulinum toxin. The patient understands and wishes to proceed. An informed consent was signed and informational brochures given to her prior to the procedure.  Procedure: The area was prepped with alcohol and dried with a clean gauze. Using a clean technique, the botulinum toxin was diluted with 1.25 cc of preservative-free normal saline which was slowly injected with an 18 gauge needle in a tuberculin syringes.  A 32 gauge needles were then used to inject the botulinum toxin. This mixture allow for an aliquot of 5 units per 0.1 cc in each injection site.    Subsequently the mixture was injected in the glabellar and forehead area with preservation of the temporal branch to the lateral eyebrow as well as into each lateral canthal area  beginning from the lateral orbital rim medial to the zygomaticus major in 3 separate areas. A total of 28 Units of botulinum toxin was used. The forehead and glabellar area was injected with care to inject intramuscular only while holding pressure on the supratrochlear vessels in each area during each injection on either side of the medial corrugators. The injection proceeded vertically superiorly to the medial 2/3 of the frontalis muscle and superior 2/3 of the lateral frontalis, again with preservation of the frontal branch. No complications were noted. Light pressure was held for 5 minutes. She was instructed explicitly in post-operative care.  Botox LOT:  [id number] C2 EXP:  3/22

## 2018-03-03 ENCOUNTER — Ambulatory Visit: Payer: Managed Care, Other (non HMO) | Admitting: Allergy & Immunology

## 2018-03-03 ENCOUNTER — Other Ambulatory Visit: Payer: Self-pay | Admitting: Allergy & Immunology

## 2018-03-03 DIAGNOSIS — J309 Allergic rhinitis, unspecified: Secondary | ICD-10-CM

## 2018-03-18 ENCOUNTER — Ambulatory Visit (INDEPENDENT_AMBULATORY_CARE_PROVIDER_SITE_OTHER): Payer: Managed Care, Other (non HMO) | Admitting: *Deleted

## 2018-03-18 DIAGNOSIS — J4540 Moderate persistent asthma, uncomplicated: Secondary | ICD-10-CM

## 2018-03-26 ENCOUNTER — Encounter: Payer: Self-pay | Admitting: Allergy

## 2018-03-26 ENCOUNTER — Ambulatory Visit (INDEPENDENT_AMBULATORY_CARE_PROVIDER_SITE_OTHER): Payer: Managed Care, Other (non HMO) | Admitting: Allergy

## 2018-03-26 VITALS — BP 100/64 | HR 82 | Resp 18

## 2018-03-26 DIAGNOSIS — J4550 Severe persistent asthma, uncomplicated: Secondary | ICD-10-CM

## 2018-03-26 MED ORDER — TIOTROPIUM BROMIDE MONOHYDRATE 1.25 MCG/ACT IN AERS: 2.0000 | INHALATION_SPRAY | Freq: Every day | RESPIRATORY_TRACT | 3 refills | Status: DC

## 2018-03-26 NOTE — Patient Instructions (Addendum)
.   Daily controller medication(s): Breo 200 1 puff once a day and rinse mouth afterwards + START Spiriva 2 puffs once ea day + Continue Xolair injections. . Prior to physical activity: May use albuterol rescue inhaler 2 puffs 5 to 15 minutes prior to strenuous physical activities. Marland Kitchen. Rescue medications: May use albuterol rescue inhaler 2 puffs or nebulizer every 4 to 6 hours as needed for shortness of breath, chest tightness, coughing, and wheezing. Monitor frequency of use.  . Asthma control goals:  o Full participation in all desired activities (may need albuterol before activity) o Albuterol use two times or less a week on average (not counting use with activity) o Cough interfering with sleep two times or less a month o Oral steroids no more than once a year o No hospitalizations Follow up in 3 months  Pet Allergen Avoidance: . Contrary to popular opinion, there are no "hypoallergenic" breeds of dogs or cats. That is because people are not allergic to an animal's hair, but to an allergen found in the animal's saliva, dander (dead skin flakes) or urine. Pet allergy symptoms typically occur within minutes. For some people, symptoms can build up and become most severe 8 to 12 hours after contact with the animal. People with severe allergies can experience reactions in public places if dander has been transported on the pet owners' clothing. Marland Kitchen. Keeping an animal outdoors is only a partial solution, since homes with pets in the yard still have higher concentrations of animal allergens. . Before getting a pet, ask your allergist to determine if you are allergic to animals. If your pet is already considered part of your family, try to minimize contact and keep the pet out of the bedroom and other rooms where you spend a great deal of time. . As with dust mites, vacuum carpets often or replace carpet with a hardwood floor, tile or linoleum. . High-efficiency particulate air (HEPA) cleaners can reduce  allergen levels over time. . While dander and saliva are the source of cat and dog allergens, urine is the source of allergens from rabbits, hamsters, mice and [place]; so ask a non-allergic family member to clean the animal's cage. . If you have a pet allergy, talk to your allergist about the potential for allergy immunotherapy (allergy shots). This strategy can often provide long-term relief.

## 2018-03-26 NOTE — Progress Notes (Signed)
Follow Up Note  RE: Kelsey Decker MRN: [id number] DOB: 02/02/1961 Date of Office Visit: 03/26/2018  Referring provider: Merri Brunette, MD Primary care provider: Merri Brunette, MD  Chief Complaint: Asthma (has been doing better than it was. she has had her 2nd round of injection. )  History of Present Illness: I had the pleasure of seeing Kelsey Decker for a follow up visit at the [facility] on 03/26/2018. She is a 57 y.o. female, who is being followed for asthma. Today she is here for regular follow up visit. Her previous allergy office visit was on 01/29/2018 with Dr. Dellis Anes.   1. Moderate persistent asthma Patient was on Xolair previously and had to stop due to insurance. Patient restarted on Xolair in October and doing better however she is worried that Xolair won't work as well this time around.   Currently on Breo 200 1 puff QD and using albuterol a few times a week for SOB and chest tightness with no specific triggers. Never was on ipratropium. Did not tolerate Singulair in the past.  No additional prednisone use since the last visit.   Assessment and Plan: Kelsey Decker is a 57 y.o. female with: Not well controlled severe persistent asthma Past history - 2009 immunocap positive to cat, dog and ragweed. Was on Xolair for many years with good benefit but had to stop due to insurance reasons which flared up the asthma. Interim history - Restarted on Xolair and doing better but still using albuterol a few times a week. . Daily controller medication(s): Breo 200 1 puff once a day and rinse mouth afterwards + START Spiriva 2 puffs once a day + Continue Xolair injections. . Prior to physical activity: May use albuterol rescue inhaler 2 puffs 5 to 15 minutes prior to strenuous physical activities. Marland Kitchen Rescue medications: May use albuterol rescue inhaler 2 puffs or nebulizer every 4 to 6 hours as needed for shortness of breath, chest tightness, coughing, and wheezing.  Monitor frequency of use.  . Discussed environmental control measures - patient has 1 dog at home.  . If doing well at next visit then consider stopping Spiriva.    Return in about 3 months (around 06/25/2018).  Meds ordered this encounter  Medications  . Tiotropium Bromide Monohydrate (SPIRIVA RESPIMAT) 1.25 MCG/ACT AERS    Sig: Inhale 2 puffs into the lungs daily.    Dispense:  4 g    Refill:  3   Diagnostics: Spirometry:  Tracings reviewed. Her effort: Good reproducible efforts. FVC: 1.99L FEV1: 1.43L, 60% predicted FEV1/FVC ratio: 72% Interpretation: Spirometry consistent with possible restrictive disease.  Please see scanned spirometry results for details.  Medication List:  Current Outpatient Medications  Medication Sig Dispense Refill  . albuterol (PROVENTIL) (2.5 MG/3ML) 0.083% nebulizer solution Take 2.5 mg by nebulization every 6 (six) hours as needed.    Marland Kitchen albuterol (VENTOLIN HFA) 108 (90 Base) MCG/ACT inhaler Inhale 2 puffs into the lungs every 4 (four) hours as needed for wheezing or shortness of breath. 1 Inhaler 5  . buPROPion (WELLBUTRIN XL) 150 MG 24 hr tablet Take 150 mg by mouth daily.  5  . estradiol (VIVELLE-DOT) 0.1 MG/24HR patch See admin instructions.  0  . ESTRING 2 MG vaginal ring See admin instructions.  3  . fluticasone furoate-vilanterol (BREO ELLIPTA) 200-25 MCG/INH AEPB Inhale 1 puff into the lungs daily. 28 each 5  . ipratropium-albuterol (DUONEB) 0.5-2.5 (3) MG/3ML SOLN TAKE 3 MLS BY NEBULIZATION EVERY 4 (FOUR)  HOURS AS NEEDED. 360 mL 1  . progesterone (PROMETRIUM) 100 MG capsule Take 100 mg by mouth at bedtime.  12  . zolpidem (AMBIEN) 10 MG tablet Take 10 mg by mouth at bedtime as needed.    . Tiotropium Bromide Monohydrate (SPIRIVA RESPIMAT) 1.25 MCG/ACT AERS Inhale 2 puffs into the lungs daily. 4 g 3   Current Facility-Administered Medications  Medication Dose Route Frequency Provider Last Rate Last Dose  . omalizumab Geoffry Paradise(XOLAIR) injection 150 mg   150 mg Subcutaneous Q28 days Jessica PriestKozlow, Eric J, MD   150 mg at 03/18/18 1525   Allergies: Allergies  Allergen Reactions  . Other Shortness Of Breath and Cough    BEE STING  . Codeine    I reviewed her past medical history, social history, family history, and environmental history and no significant changes have been reported from previous visit on 01/29/2018.  Review of Systems  Constitutional: Negative for appetite change, chills, fever and unexpected weight change.  HENT: Negative for congestion and rhinorrhea.   Eyes: Negative for itching.  Respiratory: Positive for chest tightness and shortness of breath. Negative for cough and wheezing.   Gastrointestinal: Negative for abdominal pain.  Skin: Negative for rash.  Neurological: Negative for headaches.   Objective: BP 100/64 (BP Location: Left Arm, Patient Position: Sitting, Cuff Size: Normal)   Pulse 82   Resp 18   SpO2 95%  There is no height or weight on file to calculate BMI. Physical Exam  Constitutional: She is oriented to person, place, and time. She appears well-developed and well-nourished.  HENT:  Head: Normocephalic and atraumatic.  Right Ear: External ear normal.  Left Ear: External ear normal.  Nose: Nose normal.  Mouth/Throat: Oropharynx is clear and moist.  Eyes: Conjunctivae and EOM are normal.  Neck: Neck supple.  Cardiovascular: Normal rate, regular rhythm and normal heart sounds. Exam reveals no gallop and no friction rub.  No murmur heard. Pulmonary/Chest: Effort normal and breath sounds normal. She has no wheezes. She has no rales.  Lymphadenopathy:    She has no cervical adenopathy.  Neurological: She is alert and oriented to person, place, and time.  Skin: Skin is warm. No rash noted.  Psychiatric: She has a normal mood and affect. Her behavior is normal.  Nursing note and vitals reviewed.  Previous notes and tests were reviewed. The plan was reviewed with the patient/family, and all  questions/concerned were addressed.  It was my pleasure to see Kelsey Decker today and participate in her care. Please feel free to contact me with any questions or concerns.  Sincerely,  Kelsey MoodYoon Sammye Staff, DO Allergy & Immunology  [facility] [place] office: [phone] [facility] office:[phone]

## 2018-03-26 NOTE — Assessment & Plan Note (Addendum)
Past history - 2009 immunocap positive to cat, dog and ragweed. Was on Xolair for many years with good benefit but had to stop due to insurance reasons which flared up the asthma. Interim history - Restarted on Xolair and doing better but still using albuterol a few times a week. . Daily controller medication(s): Breo 200 1 puff once a day and rinse mouth afterwards + START Spiriva 2 puffs once a day + Continue Xolair injections. . Prior to physical activity: May use albuterol rescue inhaler 2 puffs 5 to 15 minutes prior to strenuous physical activities. Marland Kitchen. Rescue medications: May use albuterol rescue inhaler 2 puffs or nebulizer every 4 to 6 hours as needed for shortness of breath, chest tightness, coughing, and wheezing. Monitor frequency of use.  . Discussed environmental control measures - patient has 1 dog at home.  . If doing well at next visit then consider stopping Spiriva.

## 2018-04-14 ENCOUNTER — Ambulatory Visit (INDEPENDENT_AMBULATORY_CARE_PROVIDER_SITE_OTHER): Payer: Managed Care, Other (non HMO) | Admitting: *Deleted

## 2018-04-14 DIAGNOSIS — J4550 Severe persistent asthma, uncomplicated: Secondary | ICD-10-CM

## 2018-05-13 ENCOUNTER — Ambulatory Visit (INDEPENDENT_AMBULATORY_CARE_PROVIDER_SITE_OTHER): Payer: Managed Care, Other (non HMO)

## 2018-05-13 DIAGNOSIS — J4540 Moderate persistent asthma, uncomplicated: Secondary | ICD-10-CM | POA: Diagnosis not present

## 2018-05-18 ENCOUNTER — Ambulatory Visit (INDEPENDENT_AMBULATORY_CARE_PROVIDER_SITE_OTHER): Payer: Self-pay | Admitting: Plastic Surgery

## 2018-05-18 ENCOUNTER — Encounter: Payer: Self-pay | Admitting: Plastic Surgery

## 2018-05-18 VITALS — BP 103/70 | HR 69 | Temp 98.0°F | Ht 63.0 in | Wt 142.0 lb

## 2018-05-18 DIAGNOSIS — Z719 Counseling, unspecified: Secondary | ICD-10-CM

## 2018-05-18 NOTE — Progress Notes (Signed)
Preoperative Dx: hyperpigmentation of face  Postoperative Dx:  same  Procedure: laser to face   Anesthesia: none  Description of Procedure:  Risks and complications were explained to the patient. Consent was confirmed and signed. Time out was called and all information was confirmed to be correct. The area  area was prepped with alcohol and wiped dry. The IPL laser was set at 9.8 Joules to end as we started lower. The face was lasered. The patient tolerated the procedure well and there were no complications. The patient is to follow up in 4 weeks.

## 2018-06-10 ENCOUNTER — Ambulatory Visit: Payer: Self-pay

## 2018-06-17 ENCOUNTER — Ambulatory Visit (INDEPENDENT_AMBULATORY_CARE_PROVIDER_SITE_OTHER): Payer: Managed Care, Other (non HMO)

## 2018-06-17 DIAGNOSIS — J4540 Moderate persistent asthma, uncomplicated: Secondary | ICD-10-CM | POA: Diagnosis not present

## 2018-06-23 ENCOUNTER — Ambulatory Visit: Payer: Managed Care, Other (non HMO) | Admitting: Plastic Surgery

## 2018-07-15 ENCOUNTER — Ambulatory Visit (INDEPENDENT_AMBULATORY_CARE_PROVIDER_SITE_OTHER): Payer: Managed Care, Other (non HMO) | Admitting: *Deleted

## 2018-07-15 DIAGNOSIS — J4540 Moderate persistent asthma, uncomplicated: Secondary | ICD-10-CM

## 2018-08-12 ENCOUNTER — Ambulatory Visit (INDEPENDENT_AMBULATORY_CARE_PROVIDER_SITE_OTHER): Payer: Managed Care, Other (non HMO) | Admitting: *Deleted

## 2018-08-12 ENCOUNTER — Other Ambulatory Visit: Payer: Self-pay

## 2018-08-12 DIAGNOSIS — J4540 Moderate persistent asthma, uncomplicated: Secondary | ICD-10-CM

## 2018-09-09 ENCOUNTER — Ambulatory Visit (INDEPENDENT_AMBULATORY_CARE_PROVIDER_SITE_OTHER): Payer: Managed Care, Other (non HMO) | Admitting: *Deleted

## 2018-09-09 ENCOUNTER — Other Ambulatory Visit: Payer: Self-pay

## 2018-09-09 DIAGNOSIS — J4540 Moderate persistent asthma, uncomplicated: Secondary | ICD-10-CM | POA: Diagnosis not present

## 2018-10-07 ENCOUNTER — Ambulatory Visit: Payer: Managed Care, Other (non HMO)

## 2018-10-07 ENCOUNTER — Telehealth: Payer: Self-pay | Admitting: *Deleted

## 2018-10-07 NOTE — Telephone Encounter (Signed)
Patient advised that she has had insurance change to [facility] from [place] tha went into effect 6/1. She was wondering about payment for her medication shipped from [place].  ia dvised her that Epic still shows she has active coverage so I really dont know if they will go ahead and pay for same or if she may get billed. I did advise her that pharmacy policy usually will not take RX back.  I advised her for more answers to reach out to pharmacy and she can check with copay card program to see if they would cover in the event Ins does not.

## 2018-12-31 ENCOUNTER — Ambulatory Visit (INDEPENDENT_AMBULATORY_CARE_PROVIDER_SITE_OTHER): Payer: 59 | Admitting: *Deleted

## 2018-12-31 DIAGNOSIS — J4540 Moderate persistent asthma, uncomplicated: Secondary | ICD-10-CM

## 2019-01-26 ENCOUNTER — Other Ambulatory Visit: Payer: Self-pay | Admitting: *Deleted

## 2019-01-26 MED ORDER — BREO ELLIPTA 200-25 MCG/INH IN AEPB: 1.0000 | INHALATION_SPRAY | Freq: Every day | RESPIRATORY_TRACT | 0 refills | Status: DC

## 2019-01-27 ENCOUNTER — Other Ambulatory Visit: Payer: Self-pay

## 2019-01-27 ENCOUNTER — Emergency Department (HOSPITAL_BASED_OUTPATIENT_CLINIC_OR_DEPARTMENT_OTHER)
Admission: EM | Admit: 2019-01-27 | Discharge: 2019-01-27 | Disposition: A | Discharge status: Home / Self Care | Payer: 59 | Attending: Emergency Medicine | Admitting: Emergency Medicine

## 2019-01-27 ENCOUNTER — Encounter (HOSPITAL_BASED_OUTPATIENT_CLINIC_OR_DEPARTMENT_OTHER): Payer: Self-pay

## 2019-01-27 DIAGNOSIS — E039 Hypothyroidism, unspecified: Secondary | ICD-10-CM | POA: Insufficient documentation

## 2019-01-27 DIAGNOSIS — R112 Nausea with vomiting, unspecified: Secondary | ICD-10-CM | POA: Diagnosis present

## 2019-01-27 DIAGNOSIS — Z79899 Other long term (current) drug therapy: Secondary | ICD-10-CM | POA: Insufficient documentation

## 2019-01-27 HISTORY — DX: Unspecified cataract: H26.9

## 2019-01-27 LAB — COMPREHENSIVE METABOLIC PANEL
ALT: 20 U/L (ref 0–44)
AST: 20 U/L (ref 15–41)
Albumin: 4 g/dL (ref 3.5–5.0)
Alkaline Phosphatase: 62 U/L (ref 38–126)
Anion gap: 9 (ref 5–15)
BUN: 15 mg/dL (ref 6–20)
CO2: 22 mmol/L (ref 22–32)
Calcium: 8.7 mg/dL — ABNORMAL LOW (ref 8.9–10.3)
Chloride: 105 mmol/L (ref 98–111)
Creatinine, Ser: 0.78 mg/dL (ref 0.44–1.00)
GFR calc Af Amer: >60 mL/min (ref >60)
GFR calc non Af Amer: >60 mL/min (ref >60)
Glucose, Bld: 109 mg/dL — ABNORMAL HIGH (ref 70–99)
Potassium: 3.9 mmol/L (ref 3.5–5.1)
Sodium: 136 mmol/L (ref 135–145)
Total Bilirubin: 1 mg/dL (ref 0.3–1.2)
Total Protein: 6.7 g/dL (ref 6.5–8.1)

## 2019-01-27 LAB — URINALYSIS, ROUTINE W REFLEX MICROSCOPIC
Bilirubin Urine: NEGATIVE
Glucose, UA: NEGATIVE mg/dL
Hgb urine dipstick: NEGATIVE
Ketones, ur: 15 mg/dL — AB
Leukocytes,Ua: NEGATIVE
Nitrite: NEGATIVE
Protein, ur: NEGATIVE mg/dL
Specific Gravity, Urine: 1.025 (ref 1.005–1.030)
pH: 6 (ref 5.0–8.0)

## 2019-01-27 LAB — CBC WITH DIFFERENTIAL/PLATELET
Abs Immature Granulocytes: 0.03 10*3/uL (ref 0.00–0.07)
Basophils Absolute: 0 10*3/uL (ref 0.0–0.1)
Basophils Relative: 0 %
Eosinophils Absolute: 0.2 10*3/uL (ref 0.0–0.5)
Eosinophils Relative: 2 %
HCT: 46.6 % — ABNORMAL HIGH (ref 36.0–46.0)
Hemoglobin: 15.2 g/dL — ABNORMAL HIGH (ref 12.0–15.0)
Immature Granulocytes: 0 %
Lymphocytes Relative: 9 %
Lymphs Abs: 0.7 10*3/uL (ref 0.7–4.0)
MCH: 30 pg (ref 26.0–34.0)
MCHC: 32.6 g/dL (ref 30.0–36.0)
MCV: 92.1 fL (ref 80.0–100.0)
Monocytes Absolute: 0.7 10*3/uL (ref 0.1–1.0)
Monocytes Relative: 9 %
Neutro Abs: 5.8 10*3/uL (ref 1.7–7.7)
Neutrophils Relative %: 80 %
Platelets: 231 10*3/uL (ref 150–400)
RBC: 5.06 MIL/uL (ref 3.87–5.11)
RDW: 13.3 % (ref 11.5–15.5)
WBC: 7.4 10*3/uL (ref 4.0–10.5)
nRBC: 0 % (ref 0.0–0.2)

## 2019-01-27 LAB — LIPASE, BLOOD: Lipase: 32 U/L (ref 11–51)

## 2019-01-27 MED ORDER — SODIUM CHLORIDE 0.9 % IV BOLUS
1000.0000 mL | Freq: Once | INTRAVENOUS | Status: AC
  Administered 2019-01-27: 17:00:00 1000 mL via INTRAVENOUS

## 2019-01-27 MED ORDER — ONDANSETRON HCL 4 MG/2ML IJ SOLN
4.0000 mg | Freq: Once | INTRAMUSCULAR | Status: AC
  Administered 2019-01-27: 4 mg via INTRAVENOUS
  Filled 2019-01-27: qty 2

## 2019-01-27 MED ORDER — PROCHLORPERAZINE EDISYLATE 10 MG/2ML IJ SOLN
10.0000 mg | Freq: Once | INTRAMUSCULAR | Status: AC
  Administered 2019-01-27: 10 mg via INTRAVENOUS
  Filled 2019-01-27: qty 2

## 2019-01-27 MED ORDER — SODIUM CHLORIDE 0.9 % IV BOLUS
1000.0000 mL | Freq: Once | INTRAVENOUS | Status: AC
  Administered 2019-01-27: 1000 mL via INTRAVENOUS

## 2019-01-27 MED ORDER — ONDANSETRON 4 MG PO TBDP: 4.0000 mg | ORAL_TABLET | Freq: Three times a day (TID) | ORAL | 0 refills | Status: DC | PRN

## 2019-01-27 MED ORDER — DIPHENHYDRAMINE HCL 50 MG/ML IJ SOLN
25.0000 mg | Freq: Once | INTRAMUSCULAR | Status: AC
  Administered 2019-01-27: 18:00:00 25 mg via INTRAVENOUS
  Filled 2019-01-27: qty 1

## 2019-01-27 NOTE — ED Notes (Signed)
Pt given water 

## 2019-01-27 NOTE — ED Provider Notes (Signed)
[facility] EMERGENCY DEPARTMENT Provider Note   CSN: [id number] Arrival date & time: 01/27/19  1530     History   Chief Complaint Chief Complaint  Patient presents with  . Emesis    HPI Kelsey Decker is a 58 y.o. female with past medical history significant for esophagitis, GERD, hypothyroidism, iron deficiency, asthma presents to emergency room today with chief complaint of emesis x2 days.  Patient states she has had 5 episodes of dark emesis in the last 24 hours.  She has been unable to tolerate p.o. intake.  She denies any abdominal pain but states she has generalized abdominal soreness after multiple episodes of vomiting.  She has had 3 episodes of loose stool, denies any blood in stool.  She did not take any medications for her symptoms prior to arrival. She called her GI Dr. Kinnie ScalesMedoff who recommended ED evaluation as he only had an appointment open for tomorrow. She is reporting an associated headache that has progressively worsened since onset. Headache started just prior to arrival. Pain is located on right side of her head without radiation. She describes it as throbbing, rating 6/10 in severity. It feels like headaches she has had in the past.  Denies fever, syncope, head trauma, photophobia, phonophobia, UL throbbing,  visual changes, stiff neck, neck pain, rash, or "thunderclap" onset, urinary symptoms, cough, congestion. She ate a [place] sandwich from a restaurant yesterday as takeout, no one else ordered from restaurant so unsure if suspicious food intake.  History provided by patient with additional history obtained from chart review.        Past Medical History:  Diagnosis Date  . Asthma   . Cataract   . Erosive esophagitis   . GERD (gastroesophageal reflux disease)   . Hypothyroidism   . Iron deficiency     Patient Active Problem List   Diagnosis Date Noted  . Encounter for counseling 02/20/2018  . Impingement syndrome of left shoulder 10/25/2016   . Moderate persistent asthma with acute exacerbation 02/01/2015  . Not well controlled severe persistent asthma 12/24/2014  . Perennial allergic rhinitis 12/24/2014  . Bee sting reaction 12/24/2014  . SLOW TRANSIT CONSTIPATION 05/09/2008  . ESOPHAGEAL STRICTURE 05/05/2008  . GERD 05/05/2008  . HIATAL HERNIA 05/05/2008  . ESOPHAGITIS, HX OF 05/05/2008    Past Surgical History:  Procedure Laterality Date  . ADENOIDECTOMY    . APPENDECTOMY    . CESAREAN SECTION    . PATENT FORAMEN OVALE CLOSURE    . TONSILLECTOMY AND ADENOIDECTOMY       OB History   No obstetric history on file.      Home Medications    Prior to Admission medications   Medication Sig Start Date End Date Taking? Authorizing Provider  albuterol (PROVENTIL) (2.5 MG/3ML) 0.083% nebulizer solution Take 2.5 mg by nebulization every 6 (six) hours as needed.    [provider]  albuterol (VENTOLIN HFA) 108 (90 Base) MCG/ACT inhaler Inhale 2 puffs into the lungs every 4 (four) hours as needed for wheezing or shortness of breath. 01/29/18   Alfonse SpruceGallagher, Joel Louis, MD  buPROPion (WELLBUTRIN XL) 150 MG 24 hr tablet Take 150 mg by mouth daily. 12/13/17   [provider]  estradiol (VIVELLE-DOT) 0.1 MG/24HR patch See admin instructions. 12/30/17   [provider]  ESTRING 2 MG vaginal ring See admin instructions. 11/01/17   [provider]  fluticasone furoate-vilanterol (BREO ELLIPTA) 200-25 MCG/INH AEPB Inhale 1 puff into the lungs daily. 01/26/19  Valentina Shaggy, MD  ipratropium-albuterol (DUONEB) 0.5-2.5 (3) MG/3ML SOLN TAKE 3 MLS BY NEBULIZATION EVERY 4 (FOUR) HOURS AS NEEDED. 03/03/18   Valentina Shaggy, MD  ondansetron (ZOFRAN ODT) 4 MG disintegrating tablet Take 1 tablet (4 mg total) by mouth every 8 (eight) hours as needed for nausea or vomiting. 01/27/19   Albrizze, Kaitlyn E, PA-C  progesterone (PROMETRIUM) 100 MG capsule Take 100 mg by mouth at bedtime. 12/13/17   [provider]  Tiotropium Bromide Monohydrate (SPIRIVA RESPIMAT) 1.25 MCG/ACT AERS Inhale 2 puffs into the lungs daily. 03/26/18   Garnet Sierras, DO  zolpidem (AMBIEN) 10 MG tablet Take 10 mg by mouth at bedtime as needed.    [provider]    Family History Family History  Problem Relation Age of Onset  . Colon cancer Mother 63       deceased  . COPD Father   . Asthma Maternal Aunt     Social History Social History   Tobacco Use  . Smoking status: Never Smoker  . Smokeless tobacco: Never Used  Substance Use Topics  . Alcohol use: Yes    Comment: rarely  . Drug use: No     Allergies   Other and Codeine   Review of Systems Review of Systems  Constitutional: Negative for fatigue and fever.  Gastrointestinal: Positive for diarrhea, nausea and vomiting. Negative for abdominal pain, blood in stool and constipation.  Neurological: Positive for headaches.     Physical Exam Updated Vital Signs BP 104/84 (BP Location: Left Arm)   Pulse (!) 110   Temp 98.3 F (36.8 C) (Oral)   Resp 18   Ht 5\' 3"  (1.6 m)   Wt 67.1 kg   SpO2 96%   BMI 26.22 kg/m   Physical Exam Vitals signs and nursing note reviewed.  Constitutional:      General: She is not in acute distress.    Appearance: She is not ill-appearing.  HENT:     Head: Normocephalic and atraumatic.     Comments: No sinus or temporal tenderness.    Right Ear: Tympanic membrane and external ear normal.     Left Ear: Tympanic membrane and external ear normal.     Nose: Nose normal.     Mouth/Throat:     Mouth: Mucous membranes are dry.     Pharynx: Oropharynx is clear.  Eyes:     General: No scleral icterus.       Right eye: No discharge.        Left eye: No discharge.     Extraocular Movements: Extraocular movements intact.     Conjunctiva/sclera: Conjunctivae normal.     Pupils: Pupils are equal, round, and reactive to light.  Neck:     Musculoskeletal: Normal range of motion.     Vascular: No JVD.      Comments: Full ROM intact without spinous process TTP. No bony stepoffs or deformities, no paraspinous muscle TTP or muscle spasms. No rigidity or meningeal signs. No bruising, erythema, or swelling.  Cardiovascular:     Rate and Rhythm: Regular rhythm. Tachycardia present.     Pulses: Normal pulses.          Radial pulses are 2+ on the right side and 2+ on the left side.     Heart sounds: Normal heart sounds.  Pulmonary:     Comments: Lungs clear to auscultation in all fields. Symmetric chest rise. No wheezing, rales, or rhonchi. Abdominal:  General: Bowel sounds are normal.     Tenderness: There is no right CVA tenderness or left CVA tenderness.     Comments: Abdomen is soft, non-distended. Generalized abdominal tenderness. No rigidity, no guarding. No peritoneal signs.  Musculoskeletal: Normal range of motion.  Skin:    General: Skin is warm and dry.     Capillary Refill: Capillary refill takes less than 2 seconds.     Findings: No rash.  Neurological:     Mental Status: She is oriented to person, place, and time.     GCS: GCS eye subscore is 4. GCS verbal subscore is 5. GCS motor subscore is 6.     Comments: Speech is clear and goal oriented, follows commands CN III-XII intact, no facial droop Normal strength in upper and lower extremities bilaterally including dorsiflexion and plantar flexion, strong and equal grip strength Sensation normal to light and sharp touch Moves extremities without ataxia, coordination intact Normal finger to nose and rapid alternating movements Normal gait and balance  Psychiatric:        Behavior: Behavior normal.      ED Treatments / Results  Labs (all labs ordered are listed, but only abnormal results are displayed) Labs Reviewed  COMPREHENSIVE METABOLIC PANEL - Abnormal; Notable for the following components:      Result Value   Glucose, Bld 109 (*)    Calcium 8.7 (*)    All other components within normal limits  CBC WITH  DIFFERENTIAL/PLATELET - Abnormal; Notable for the following components:   Hemoglobin 15.2 (*)    HCT 46.6 (*)    All other components within normal limits  URINALYSIS, ROUTINE W REFLEX MICROSCOPIC - Abnormal; Notable for the following components:   APPearance HAZY (*)    Ketones, ur 15 (*)    All other components within normal limits  LIPASE, BLOOD    EKG None  Radiology No results found.  Procedures Procedures (including critical care time)  Medications Ordered in ED Medications  sodium chloride 0.9 % bolus 1,000 mL (0 mLs Intravenous Stopped 01/27/19 1856)  ondansetron (ZOFRAN) injection 4 mg (4 mg Intravenous Given 01/27/19 1658)  sodium chloride 0.9 % bolus 1,000 mL ( Intravenous Stopped 01/27/19 1928)  diphenhydrAMINE (BENADRYL) injection 25 mg (25 mg Intravenous Given 01/27/19 1827)  prochlorperazine (COMPAZINE) injection 10 mg (10 mg Intravenous Given 01/27/19 1831)     Initial Impression / Assessment and Plan / ED Course  I have reviewed the triage vital signs and the nursing notes.  Pertinent labs & imaging results that were available during my care of the patient were reviewed by me and considered in my medical decision making (see chart for details).  Patient presents to the ED with complaints of nausea, vomiting, diarrhea, headache.. Patient nontoxic appearing, in no apparent distress. She is afebrile. Noted to be tachycardic in triage, but during my exam heart rate in 80s. Also on exam, mucus membranes are dry. She has generalized abdominal tenderness, no peritoneal signs. Neuro exam without focal deficit. SAH unlikely as headache has progressively worsened and is not the worst of her life. Vomiting approximately 12 hours after vomiting.  Labs reviewed and grossly unremarkable. No leukocytosis. Hemoglobin is atable at 15.2, comparable to her baseline. No significant electrolyte derangements. LFTs, renal function, and lipase WNL. Urinalysis without obvious infection but  does have ketones suggesting dehydration.    On repeat abdominal exam patient remains without peritoneal signs, doubt acute infectious process. Pt given 2L of IVF and reports feeling better.  Her blood pressures have been soft but looking through her chart consistent with her baseline. Patient tolerating PO in the emergency department. Headache improved after IV compazine and benadryl. Will discharge home with supportive measures. I discussed results, treatment plan, need for GI follow-up, and return precautions with the patient. Provided opportunity for questions, patient confirmed understanding and is in agreement with plan. Pt has appointment scheduled with GI tomorrow. Findings and plan of care discussed with supervising physician Dr. Rush Landmark.   Portions of this note were generated with Scientist, clinical (histocompatibility and immunogenetics). Dictation errors may occur despite best attempts at proofreading.   Final Clinical Impressions(s) / ED Diagnoses   Final diagnoses:  Nausea vomiting and diarrhea    ED Discharge Orders         Ordered    ondansetron (ZOFRAN ODT) 4 MG disintegrating tablet  Every 8 hours PRN     01/27/19 2025           Sherene Sires, PA-C 01/28/19 6767    Tegeler, Canary Brim, MD 01/28/19 1115

## 2019-01-27 NOTE — Discharge Instructions (Addendum)
You have been seen today for nausea and vomiting. Please read and follow all provided instructions. Return to the emergency room for worsening condition or new concerning symptoms.    Your lab work today was reassuring.  There are no signs of infection.  Your hemoglobin is stable at 15.3.  1. Medications:  Prescription sent to your pharmacy for Zofran.  You can take this as needed for nausea. Continue usual home medications Take medications as prescribed. Please review all of the medicines and only take them if you do not have an allergy to them.   2. Treatment: rest, drink plenty of fluids  3. Follow Up: Please follow up with GI tomorrow at your scheduled appointment.  It is also a possibility that you have an allergic reaction to any of the medicines that you have been prescribed - Everybody reacts differently to medications and while MOST people have no trouble with most medicines, you may have a reaction such as nausea, vomiting, rash, swelling, shortness of breath. If this is the case, please stop taking the medicine immediately and contact your physician.  ?

## 2019-01-27 NOTE — ED Triage Notes (Signed)
Pt c/o n/v started last night-states emesis is "dark"-one episode of diarrhea-NAD-steady gait

## 2019-01-28 ENCOUNTER — Ambulatory Visit: Payer: Managed Care, Other (non HMO)

## 2019-02-04 ENCOUNTER — Ambulatory Visit: Payer: Self-pay

## 2019-02-08 ENCOUNTER — Ambulatory Visit (INDEPENDENT_AMBULATORY_CARE_PROVIDER_SITE_OTHER): Payer: 59

## 2019-02-08 ENCOUNTER — Other Ambulatory Visit: Payer: Self-pay

## 2019-02-08 DIAGNOSIS — J4540 Moderate persistent asthma, uncomplicated: Secondary | ICD-10-CM | POA: Diagnosis not present

## 2019-02-20 ENCOUNTER — Other Ambulatory Visit: Payer: Self-pay | Admitting: Allergy & Immunology

## 2019-03-08 ENCOUNTER — Ambulatory Visit: Payer: Self-pay

## 2019-03-12 ENCOUNTER — Ambulatory Visit (INDEPENDENT_AMBULATORY_CARE_PROVIDER_SITE_OTHER): Payer: 59

## 2019-03-12 ENCOUNTER — Other Ambulatory Visit: Payer: Self-pay

## 2019-03-12 DIAGNOSIS — J4540 Moderate persistent asthma, uncomplicated: Secondary | ICD-10-CM

## 2019-04-09 ENCOUNTER — Ambulatory Visit: Payer: Self-pay

## 2019-05-07 ENCOUNTER — Other Ambulatory Visit: Payer: 59

## 2019-09-21 ENCOUNTER — Telehealth: Payer: Self-pay | Admitting: *Deleted

## 2019-09-21 NOTE — Telephone Encounter (Signed)
Patient called and was wanting you to give her a call to discuss her Xolair injections with her before she scheduled an appointment to come into the office. Please advise.

## 2019-09-21 NOTE — Telephone Encounter (Signed)
Sent a note to Tammy asking her to arrange for home administration Xolair.

## 2019-09-22 ENCOUNTER — Telehealth: Payer: Self-pay | Admitting: *Deleted

## 2019-09-22 NOTE — Telephone Encounter (Signed)
-----   Message from Jessica Priest, MD sent at 09/21/2019  3:53 PM EDT ----- [name], can we start her on Xolair home administration? She is frustrated and has not been receiving Xolair for 4 months and her asthma is flaring up. Sounds like she has had insurance trouble and maybe clinic trouble getting her Xolair. Please call her and discuss and lets get her some help. Thanks.

## 2019-09-22 NOTE — Telephone Encounter (Signed)
Called patient and discussed restarting Xolair for self admin at home.  Advised patient she would need appt for approval since she has not been seen in 18 months.  Appt made with Thurston Hole for 6/4

## 2019-09-23 NOTE — Patient Instructions (Addendum)
Asthma Increase Breo 200--1 puff once a day to prevent cough or wheeze Restart Spiriva 1.25 mcg-2 puffs once a day to prevent cough or wheeze Continue albuterol 2 puffs every 4 hours as needed for cough or wheeze or instead albuterol 0.083% via nebulizer once every 4 hours as needed for cough or wheeze. Restart Xolair injections 150 mg once every 4 weeks and have access to an epinephrine autoinjector set  Allergic rhinitis  Continue allergen avoidance measures directed toward cat, dog, and ragweed as listed below  Allergic conjunctivitis Some over the counter eye drops include Pataday one drop in each eye once a day as needed for red, itchy eyes OR Zaditor one drop in each eye twice a day as needed for red itchy eyes.  Stinging insect allergy Continue to avoid stinging insects. In case of an allergic reaction, Benadryl 50 mg every 4 hours, and if life-threatening symptoms occur, inject with AuviQ 0.3 mg.  Call the clinic if this treatment plan is not working well for you  Follow up in 3 months or sooner if needed.  Reducing Pollen Exposure The American Academy of Allergy, Asthma and Immunology suggests the following steps to reduce your exposure to pollen during allergy seasons. 1. Do not hang sheets or clothing out to dry; pollen may collect on these items. 2. Do not mow lawns or spend time around freshly cut grass; mowing stirs up pollen. 3. Keep windows closed at night.  Keep car windows closed while driving. 4. Minimize morning activities outdoors, a time when pollen counts are usually at their highest. 5. Stay indoors as much as possible when pollen counts or humidity is high and on windy days when pollen tends to remain in the air longer. 6. Use air conditioning when possible.  Many air conditioners have filters that trap the pollen spores. 7. Use a HEPA room air filter to remove pollen form the indoor air you breathe. ; Control of Dog or Cat Allergen Avoidance is the best way to  manage a dog or cat allergy. If you have a dog or cat and are allergic to dog or cats, consider removing the dog or cat from the home. If you have a dog or cat but dont want to find it a new home, or if your family wants a pet even though someone in the household is allergic, here are some strategies that may help keep symptoms at bay:  8. Keep the pet out of your bedroom and restrict it to only a few rooms. Be advised that keeping the dog or cat in only one room will not limit the allergens to that room. 9. Dont pet, hug or kiss the dog or cat; if you do, wash your hands with soap and water. 10. High-efficiency particulate air (HEPA) cleaners run continuously in a bedroom or living room can reduce allergen levels over time. 11. Regular use of a high-efficiency vacuum cleaner or a central vacuum can reduce allergen levels. 12. Giving your dog or cat a bath at least once a week can reduce airborne allergen.

## 2019-09-23 NOTE — Progress Notes (Signed)
[address] [place] [address] Dept: [phone]  FOLLOW UP NOTE  Patient ID: Kelsey Decker, female    DOB: 06/17/1960  Age: 59 y.o. MRN: [id number] Date of Office Visit: 09/24/2019  Assessment  Chief Complaint: Asthma  HPI Kelsey Decker is a 59 year old female who presents to the clinic for a follow-up visit.  She was last seen in this clinic on 03/26/2018 for evaluation of asthma and allergic rhinitis.  At today's visit she reports her asthma has not been well controlled with symptoms including shortness of breath with activity and while outside, wheeze occurring occasionally at nighttime and when cutting the grass, and she denies cough.  She continues Breo 201 puff about once a week and is not currently using Spiriva.  She reports using albuterol at least twice a day with moderate relief of symptoms.  She reports a decrease in her symptoms of asthma while using Xolair.  Her last Xolair injection was 150 mg on 03/12/2019.  Allergic rhinitis is reported as moderately well controlled with occasional loratadine when she is exposed to cats or pets.  Allergic conjunctivitis is reported as moderately well controlled until exposed to pets after which she experiences itchy, watery, red, and swollen eyes.  She reports an anaphylactic reaction to a bee sting in the past and continues to carry an epinephrine autoinjector with no reported bee stings or epinephrine use since her last visit to this clinic.  Her current medications are listed in the chart.   Drug Allergies:  Allergies  Allergen Reactions  . Other Shortness Of Breath and Cough    BEE STING  . Codeine     Physical Exam: BP 102/62   Pulse 77   Temp 98.3 F (36.8 C) (Temporal)   Resp 16   Ht 5\' 3"  (1.6 m)   Wt 157 lb 6.4 oz (71.4 kg)   SpO2 96%   BMI 27.88 kg/m    Physical Exam Vitals reviewed.  Constitutional:      Appearance: Normal appearance.  HENT:     Head: Normocephalic and atraumatic.      Right Ear: Tympanic membrane normal.     Left Ear: Tympanic membrane normal.     Nose:     Comments: Bilateral nares normal.  Pharynx normal.  Ears normal.  Eyes normal.    Mouth/Throat:     Pharynx: Oropharynx is clear.  Eyes:     Conjunctiva/sclera: Conjunctivae normal.  Cardiovascular:     Rate and Rhythm: Normal rate and regular rhythm.     Heart sounds: Normal heart sounds. No murmur.  Pulmonary:     Effort: Pulmonary effort is normal.     Breath sounds: Normal breath sounds.     Comments: Lungs clear to auscultation Musculoskeletal:        General: Normal range of motion.     Cervical back: Normal range of motion and neck supple.  Skin:    General: Skin is warm and dry.  Neurological:     Mental Status: She is alert and oriented to person, place, and time.  Psychiatric:        Mood and Affect: Mood normal.        Behavior: Behavior normal.        Thought Content: Thought content normal.        Judgment: Judgment normal.     Diagnostics: FVC 2.36, FEV1 1.61.  Predicted FVC 3.16, predicted FEV1 2.43.  Spirometry indicates mild restriction and mild airway obstruction.  This is consistent or improved when compared with her last several spirometry readings.  Assessment and Plan: 1. Moderate persistent asthma with acute exacerbation   2. Perennial allergic rhinitis   3. Hymenoptera allergy   4. Seasonal allergic conjunctivitis     Meds ordered this encounter  Medications  . albuterol (VENTOLIN HFA) 108 (90 Base) MCG/ACT inhaler    Sig: Inhale 2 puffs into the lungs every 4 (four) hours as needed for wheezing or shortness of breath.    Dispense:  18 g    Refill:  1  . EPINEPHrine (AUVI-Q) 0.3 mg/0.3 mL IJ SOAJ injection    Sig: Inject 0.3 mLs (0.3 mg total) into the muscle once for 1 dose. As directed for life-threatening allergic reactions    Dispense:  2 each    Refill:  1    [phone]    Patient Instructions  Asthma Increase Breo 200--1 puff once a day to  prevent cough or wheeze Restart Spiriva 1.25 mcg-2 puffs once a day to prevent cough or wheeze Continue albuterol 2 puffs every 4 hours as needed for cough or wheeze or instead albuterol 0.083% via nebulizer once every 4 hours as needed for cough or wheeze. Restart Xolair injections 150 mg once every 4 weeks and have access to an epinephrine autoinjector set  Allergic rhinitis  Continue allergen avoidance measures directed toward cat, dog, and ragweed as listed below  Allergic conjunctivitis Some over the counter eye drops include Pataday one drop in each eye once a day as needed for red, itchy eyes OR Zaditor one drop in each eye twice a day as needed for red itchy eyes.  Stinging insect allergy Continue to avoid stinging insects. In case of an allergic reaction, Benadryl 50 mg every 4 hours, and if life-threatening symptoms occur, inject with AuviQ 0.3 mg.  Call the clinic if this treatment plan is not working well for you  Follow up in 3 months or sooner if needed.  Return in about 3 months (around 12/25/2019), or if symptoms worsen or fail to improve.    Thank you for the opportunity to care for this patient.  Please do not hesitate to contact me with questions.  Gareth Morgan, FNP [facility]

## 2019-09-24 ENCOUNTER — Other Ambulatory Visit: Payer: Self-pay

## 2019-09-24 ENCOUNTER — Ambulatory Visit: Payer: 59 | Admitting: Family Medicine

## 2019-09-24 ENCOUNTER — Encounter: Payer: Self-pay | Admitting: Family Medicine

## 2019-09-24 VITALS — BP 102/62 | HR 77 | Temp 98.3°F | Resp 16 | Ht 63.0 in | Wt 157.4 lb

## 2019-09-24 DIAGNOSIS — J4541 Moderate persistent asthma with (acute) exacerbation: Secondary | ICD-10-CM | POA: Diagnosis not present

## 2019-09-24 DIAGNOSIS — H1010 Acute atopic conjunctivitis, unspecified eye: Secondary | ICD-10-CM | POA: Insufficient documentation

## 2019-09-24 DIAGNOSIS — J3089 Other allergic rhinitis: Secondary | ICD-10-CM | POA: Diagnosis not present

## 2019-09-24 DIAGNOSIS — Z91030 Bee allergy status: Secondary | ICD-10-CM

## 2019-09-24 DIAGNOSIS — Z91038 Other insect allergy status: Secondary | ICD-10-CM | POA: Insufficient documentation

## 2019-09-24 MED ORDER — ALBUTEROL SULFATE HFA 108 (90 BASE) MCG/ACT IN AERS: 2.0000 | INHALATION_SPRAY | RESPIRATORY_TRACT | 1 refills | Status: DC | PRN

## 2019-09-24 MED ORDER — EPINEPHRINE 0.3 MG/0.3ML IJ SOAJ: 0.3000 mg | Freq: Once | INTRAMUSCULAR | 1 refills | Status: AC

## 2019-09-29 ENCOUNTER — Telehealth: Payer: Self-pay | Admitting: Family Medicine

## 2019-09-29 MED ORDER — BREO ELLIPTA 200-25 MCG/INH IN AEPB: 1.0000 | INHALATION_SPRAY | Freq: Every day | RESPIRATORY_TRACT | 5 refills | Status: DC

## 2019-09-29 MED ORDER — SPIRIVA RESPIMAT 1.25 MCG/ACT IN AERS: 2.0000 | INHALATION_SPRAY | Freq: Every day | RESPIRATORY_TRACT | 5 refills | Status: DC

## 2019-09-29 NOTE — Telephone Encounter (Signed)
Patient called and needs a refill on Spiriva and Breo sent to [facility] on [address].  Please advise.

## 2019-09-29 NOTE — Telephone Encounter (Signed)
Virgel Bouquet and Spiriva sent to [facility] and patient was notified.

## 2019-10-15 ENCOUNTER — Other Ambulatory Visit: Payer: Self-pay

## 2019-10-15 ENCOUNTER — Ambulatory Visit (INDEPENDENT_AMBULATORY_CARE_PROVIDER_SITE_OTHER): Payer: 59

## 2019-10-15 DIAGNOSIS — J4541 Moderate persistent asthma with (acute) exacerbation: Secondary | ICD-10-CM

## 2019-10-15 DIAGNOSIS — J4540 Moderate persistent asthma, uncomplicated: Secondary | ICD-10-CM

## 2019-11-16 ENCOUNTER — Ambulatory Visit (INDEPENDENT_AMBULATORY_CARE_PROVIDER_SITE_OTHER): Payer: 59 | Admitting: Family Medicine

## 2019-11-30 ENCOUNTER — Ambulatory Visit (INDEPENDENT_AMBULATORY_CARE_PROVIDER_SITE_OTHER): Payer: 59 | Admitting: Family Medicine

## 2019-12-07 ENCOUNTER — Other Ambulatory Visit: Payer: Self-pay

## 2019-12-07 LAB — LAB REPORT - SCANNED
A1c: 5.4
T4,Free (Direct): 0.83
TSH: 0.77
Vit D, 25-Hydroxy: 34.7
Vitamin B12 Bind Capacity: 546

## 2019-12-21 ENCOUNTER — Other Ambulatory Visit: Payer: Self-pay

## 2019-12-21 ENCOUNTER — Ambulatory Visit: Payer: 59 | Admitting: Endocrinology

## 2019-12-21 ENCOUNTER — Encounter: Payer: Self-pay | Admitting: Endocrinology

## 2019-12-21 VITALS — BP 114/80 | HR 75 | Ht 63.0 in | Wt 157.4 lb

## 2019-12-21 DIAGNOSIS — R635 Abnormal weight gain: Secondary | ICD-10-CM | POA: Diagnosis not present

## 2019-12-21 DIAGNOSIS — E119 Type 2 diabetes mellitus without complications: Secondary | ICD-10-CM

## 2019-12-21 MED ORDER — DEXAMETHASONE 1 MG PO TABS: ORAL_TABLET | ORAL | 0 refills | Status: DC

## 2019-12-21 NOTE — Progress Notes (Signed)
Subjective:    Patient ID: Kelsey Decker, female    DOB: 18-May-1960, 59 y.o.   MRN: [id number]  HPI Pt is referred by Dr Katrinka Blazing, to consider endocrine cause of weight gain.  Pt says she has gained 15 lbs x 6 mos  She has not recently taken any steroids.  She has no h/o cancer, pituitary disorder, skin ulcers, PUD, HTN, osteoporosis, serious infection, bony fracture, or adrenal disorder. She has hypothyroidism x 10 years.  She had GDM in approx 1990.  She reports constipation, dry skin, hair loss, difficulty with concentration, cold intolerance, easy bruising, and fatigue.  Past Medical History:  Diagnosis Date   Asthma    Cataract    Erosive esophagitis    GERD (gastroesophageal reflux disease)    Hypothyroidism    Iron deficiency     Past Surgical History:  Procedure Laterality Date   ADENOIDECTOMY     APPENDECTOMY     CESAREAN SECTION     PATENT FORAMEN OVALE CLOSURE     TONSILLECTOMY AND ADENOIDECTOMY      Social History   Socioeconomic History   Marital status: Married    Spouse name: Not on file   Number of children: 1   Years of education: Not on file   Highest education level: Not on file  Occupational History   Occupation: Restaurant manager, [employer]  Tobacco Use   Smoking status: Never Smoker   Smokeless tobacco: Never Used  Building services engineer Use: Never used  Substance and Sexual Activity   Alcohol use: Yes    Comment: rarely   Drug use: No   Sexual activity: Not on file  Other Topics Concern   Not on file  Social History Narrative   Not on file   Social Determinants of Health   Financial Resource Strain:    Difficulty of Paying Living Expenses: Not on file  Food Insecurity:    Worried About Programme researcher, broadcasting/film/video in the Last Year: Not on file   [employer] of Food in the Last Year: Not on file  Transportation Needs:    Lack of Transportation (Medical): Not on file   Lack of Transportation (Non-Medical): Not on  file  Physical Activity:    Days of Exercise per Week: Not on file   Minutes of Exercise per Session: Not on file  Stress:    Feeling of Stress : Not on file  Social Connections:    Frequency of Communication with Friends and Family: Not on file   Frequency of Social Gatherings with Friends and Family: Not on file   Attends Religious Services: Not on file   Active Member of Clubs or Organizations: Not on file   Attends Banker Meetings: Not on file   Marital Status: Not on file  Intimate Partner Violence:    Fear of Current or Ex-Partner: Not on file   Emotionally Abused: Not on file   Physically Abused: Not on file   Sexually Abused: Not on file    Current Outpatient Medications on File Prior to Visit  Medication Sig Dispense Refill   albuterol (PROVENTIL) (2.5 MG/3ML) 0.083% nebulizer solution Take 2.5 mg by nebulization every 6 (six) hours as needed.     albuterol (VENTOLIN HFA) 108 (90 Base) MCG/ACT inhaler Inhale 2 puffs into the lungs every 4 (four) hours as needed for wheezing or shortness of breath. 18 g 1   Cholecalciferol (VITAMIN D3) 125 MCG (5000 UT) CAPS  Take 2-3 capsules by mouth daily.     estradiol (VIVELLE-DOT) 0.1 MG/24HR patch See admin instructions.  0   ESTRING 2 MG vaginal ring See admin instructions.  3   fluticasone furoate-vilanterol (BREO ELLIPTA) 200-25 MCG/INH AEPB Inhale 1 puff into the lungs daily. 30 each 5   ipratropium-albuterol (DUONEB) 0.5-2.5 (3) MG/3ML SOLN TAKE 3 MLS BY NEBULIZATION EVERY 4 (FOUR) HOURS AS NEEDED. 360 mL 1   levothyroxine (SYNTHROID) 25 MCG tablet 25 mcg daily.     Naltrexone-buPROPion HCl ER (CONTRAVE) 8-90 MG TB12 Take 1 tablet by mouth daily.     progesterone (PROMETRIUM) 100 MG capsule Take 100 mg by mouth at bedtime.  12   SUPER B COMPLEX/C PO Take 1 tablet by mouth daily.     Tiotropium Bromide Monohydrate (SPIRIVA RESPIMAT) 1.25 MCG/ACT AERS Inhale 2 puffs into the lungs daily. 4 g 5     UNABLE TO FIND Intestinal support 1 tab prn     Current Facility-Administered Medications on File Prior to Visit  Medication Dose Route Frequency Provider Last Rate Last Admin   omalizumab Geoffry Paradise) injection 150 mg  150 mg Subcutaneous Q28 days Jessica Priest, MD   150 mg at 10/15/19 0945    Allergies  Allergen Reactions   Other Shortness Of Breath and Cough    BEE STING   Codeine     Family History  Problem Relation Age of Onset   Colon cancer Mother 16       deceased   COPD Father    Asthma Maternal Aunt    Thyroid disease Neg Hx     BP 114/80    Pulse 75    Ht 5\' 3"  (1.6 m)    Wt 157 lb 6.4 oz (71.4 kg)    SpO2 98%    BMI 27.88 kg/m     Review of Systems denies headache, hirsutism, muscle weakness, and depression.     Objective:   Physical Exam VS: see vs page GEN: no distress HEAD: head: no deformity eyes: no periorbital swelling, no proptosis.  No periorbital swelling.   external nose and ears are normal NECK: supple, thyroid is not enlarged CHEST WALL: no deformity LUNGS: clear to auscultation CV: reg rate and rhythm, no murmur.  MUSCULOSKELETAL: muscle bulk and strength are grossly normal.  no obvious joint swelling.  gait is normal and steady EXTEMITIES: no deformity.  no leg edema.   PULSES: no carotid bruit NEURO:  cn 2-12 grossly intact.   readily moves all 4's.  sensation is intact to touch on all 4's SKIN:  Normal texture and temperature.  No rash or suspicious lesion is visible.  No striae. NODES:  None palpable at the neck PSYCH: alert, well-oriented.  Does not appear anxious nor depressed.   Lab Results  Component Value Date   TSH 0.77 11/01/2019   I have reviewed outside records, and summarized: Pt reported weight gain, and referred here.  Hypothyroidism, insomnia, dyslipidemia, HRT, and fatigue were also addressed.     Assessment & Plan:  Weight gain, uncertain etiology or prognosis.  I have sent a prescription to your pharmacy,  for the decadron.   Patient Instructions  Let's check a 24-HR urine collection.   Then you should do a "dexamethasone suppression test."  For this, you would take dexamethasone 1 mg at 10 pm (I have sent a prescription to your pharmacy), then come in for a "cortisol" blood test the next morning before 9 am.  You do  not need to be fasting for this test.

## 2019-12-21 NOTE — Patient Instructions (Addendum)
Let's check a 24-HR urine collection.   Then you should do a "dexamethasone suppression test."  For this, you would take dexamethasone 1 mg at 10 pm (I have sent a prescription to your pharmacy), then come in for a "cortisol" blood test the next morning before 9 am.  You do not need to be fasting for this test.

## 2019-12-23 ENCOUNTER — Other Ambulatory Visit: Payer: 59

## 2019-12-23 ENCOUNTER — Other Ambulatory Visit: Payer: Self-pay

## 2019-12-23 DIAGNOSIS — R635 Abnormal weight gain: Secondary | ICD-10-CM

## 2019-12-24 ENCOUNTER — Other Ambulatory Visit (INDEPENDENT_AMBULATORY_CARE_PROVIDER_SITE_OTHER): Payer: 59

## 2019-12-24 DIAGNOSIS — R635 Abnormal weight gain: Secondary | ICD-10-CM

## 2019-12-24 LAB — CORTISOL: Cortisol, Plasma: 0.5 ug/dL

## 2019-12-29 LAB — CORTISOL, URINE, 24 HOUR
24 Hour urine volume (VMAHVA): 1800 mL
Cortisol (Ur), Free: 18.7 mcg/24 h (ref 4.0–50.0)
RESULTS RECEIVED: 1.04 g/(24.h) (ref 0.50–2.15)

## 2020-01-13 ENCOUNTER — Telehealth: Payer: Self-pay

## 2020-01-13 ENCOUNTER — Other Ambulatory Visit: Payer: Self-pay

## 2020-01-13 ENCOUNTER — Encounter: Payer: Self-pay | Admitting: Allergy

## 2020-01-13 ENCOUNTER — Ambulatory Visit (INDEPENDENT_AMBULATORY_CARE_PROVIDER_SITE_OTHER): Payer: 59 | Admitting: Allergy

## 2020-01-13 ENCOUNTER — Other Ambulatory Visit: Payer: 59

## 2020-01-13 DIAGNOSIS — J069 Acute upper respiratory infection, unspecified: Secondary | ICD-10-CM | POA: Diagnosis not present

## 2020-01-13 DIAGNOSIS — Z91030 Bee allergy status: Secondary | ICD-10-CM

## 2020-01-13 DIAGNOSIS — Z20822 Contact with and (suspected) exposure to covid-19: Secondary | ICD-10-CM

## 2020-01-13 DIAGNOSIS — J3089 Other allergic rhinitis: Secondary | ICD-10-CM | POA: Diagnosis not present

## 2020-01-13 DIAGNOSIS — H1010 Acute atopic conjunctivitis, unspecified eye: Secondary | ICD-10-CM | POA: Diagnosis not present

## 2020-01-13 DIAGNOSIS — J4541 Moderate persistent asthma with (acute) exacerbation: Secondary | ICD-10-CM | POA: Diagnosis not present

## 2020-01-13 DIAGNOSIS — Z91038 Other insect allergy status: Secondary | ICD-10-CM

## 2020-01-13 MED ORDER — PREDNISONE 10 MG PO TABS: ORAL_TABLET | ORAL | 0 refills | Status: AC

## 2020-01-13 MED ORDER — IPRATROPIUM-ALBUTEROL 0.5-2.5 (3) MG/3ML IN SOLN: 3.0000 mL | RESPIRATORY_TRACT | 1 refills | Status: DC | PRN

## 2020-01-13 NOTE — Telephone Encounter (Signed)
Patient called stating she is experiencing Cough/Bronchitis symptoms. She states she normally gets these symptoms around this year and has had her cough, congestion, running nose, and sinus headache for 2 days. Patient currently doesn't have any fevers, no sore throat and diarrhea. She hasn't travel and stays safe and avoids as much contact as possible as well as she is up to date on her COVID vaccine. Patient can call [place] COVID-19 testing to schedule and appointment at [phone]. Please advise if she should get tested or needs to be seen soon. Thanks.

## 2020-01-13 NOTE — Progress Notes (Signed)
RE: Kelsey Decker MRN: [id number] DOB: October 03, 1960 Date of Telemedicine Visit: 01/13/2020  Referring provider: Merri Brunette, MD Primary care provider: Merri Brunette, MD  Chief Complaint: Cough (started 2 days ago with a sneeze and has moved down in chest now coughing up green phlegm)   Telemedicine Follow Up Visit via Telephone: I connected with Kelsey Decker for a follow up on 01/14/20 by telephone and verified that I am speaking with the correct person using two identifiers.   I discussed the limitations, risks, security and privacy concerns of performing an evaluation and management service by telephone and the availability of in person appointments. I also discussed with the patient that there may be a patient responsible charge related to this service. The patient expressed understanding and agreed to proceed.  Patient is at home.   Provider is at the office.  Visit start time: 1416 Visit end time: 1439 Insurance consent/check in by: Romeo Rabon Medical consent and medical assistant/nurse: Irwin Brakeman  History of Present Illness: She is a 59 y.o. female, has a visit today for a sick visit via telemedicine. Her previous allergy office visit was on 10/14/19 with Thermon Leyland, FNP. She follows with [place] for management of asthma, allergic rhinitis with conjunctivitis and hymenoptera allergy.  She states 2 days ago she started having symptoms of sneezing which then turned into productive cough. She states normally when her sinus symptoms are moved down to her chest she ends up with bronchitis and has had pneumonia before. She is concerned that if she does not catch the symptoms sooner that it will progress into bronchitis/pneumonia. She denies any sinus pressure or pain but states she has been blowing mucus from the nose. She has also been having a headache. With her productive cough she has been needing to use her albuterol via nebulizer which she used twice today with some relief of  symptoms. She denies any fever, night sweats or chills at this time. She did call the office with the symptoms and I received the message and I recommended that she have Covid testing done based on her symptoms. She is fully vaccinated however she is approaching 6 months from her last dose. By the time of this televisit she had gone to get a Covid test however it was not a rapid thus she is waiting for the results at this time. She states she has continued to use her regular medications of Breo and Spiriva and states she has been on top of her Xolair monthly injections. She is doing her Xolair self-administered at home. At this time she is not taking any antihistamines and reports she has had some adverse symptoms with antihistamines in the past. She is taking over-the-counter Zicam and using Aleve. She states normally for symptoms of like which she is having currently she has good response with Solu-Medrol injection and typically also needs prednisone oral course.   Assessment and Plan: [name] is a 59 y.o. female with:   Upper respiratory tract infection with asthma exacerbation -Covid test is pending at the time of this visit -Advised can continue her over-the-counter medications for supportive care -Given her asthma and increased symptoms in albuterol needs I have prescribed her a prednisone burst -At this time do not believe her symptoms are bacterial in nature to warrant antibiotic -Advised her to use albuterol every 4 hours while awake for the next 2 days then return to as needed use -Advise for her to let us know the results of her Covid  test as if positive for an would recommend she receive monoclonal antibody  Asthma -Continue Breo 200--1 puff once a day to prevent cough or wheeze -Continue Spiriva 1.25 mcg-2 puffs once a day to prevent cough or wheeze Continue albuterol 2 puffs every 4 hours as needed for cough or wheeze or instead albuterol 0.083% via nebulizer once every 4 hours as  needed for cough or wheeze. -Continue Xolair injections 150 mg once every 4 weeks and have access to an epinephrine autoinjector set  Allergic rhinitis  -Continue allergen avoidance measures directed toward cat, dog, and ragweed as listed below  Allergic conjunctivitis -Some over the counter eye drops include Pataday one drop in each eye once a day as needed for red, itchy eyes OR Zaditor one drop in each eye twice a day as needed for red itchy eyes.  Stinging insect allergy -Continue to avoid stinging insects. In case of an allergic reaction, Benadryl 50 mg every 4 hours, and if life-threatening symptoms occur, inject with AuviQ 0.3 mg.   Follow up in 3 months or sooner if needed.  Diagnostics: None.  Medication List:  Current Outpatient Medications  Medication Sig Dispense Refill  . albuterol (PROVENTIL) (2.5 MG/3ML) 0.083% nebulizer solution Take 2.5 mg by nebulization every 6 (six) hours as needed.    Marland Kitchen albuterol (VENTOLIN HFA) 108 (90 Base) MCG/ACT inhaler Inhale 2 puffs into the lungs every 4 (four) hours as needed for wheezing or shortness of breath. 18 g 1  . fluticasone furoate-vilanterol (BREO ELLIPTA) 200-25 MCG/INH AEPB Inhale 1 puff into the lungs daily. 30 each 5  . Tiotropium Bromide Monohydrate (SPIRIVA RESPIMAT) 1.25 MCG/ACT AERS Inhale 2 puffs into the lungs daily. 4 g 5  . Cholecalciferol (VITAMIN D3) 125 MCG (5000 UT) CAPS Take 2-3 capsules by mouth daily.    Marland Kitchen dexamethasone (DECADRON) 1 MG tablet Take at 9-10 PM, the night before blood test 1 tablet 0  . estradiol (VIVELLE-DOT) 0.1 MG/24HR patch See admin instructions.  0  . ESTRING 2 MG vaginal ring See admin instructions.  3  . ipratropium-albuterol (DUONEB) 0.5-2.5 (3) MG/3ML SOLN Take 3 mLs by nebulization every 4 (four) hours as needed. 360 mL 1  . levothyroxine (SYNTHROID) 25 MCG tablet 25 mcg daily.    . Naltrexone-buPROPion HCl ER (CONTRAVE) 8-90 MG TB12 Take 1 tablet by mouth daily.    . predniSONE  (DELTASONE) 10 MG tablet Take 3 tablets (30 mg total) by mouth 2 (two) times daily with a meal for 2 days, THEN 2 tablets (20 mg total) 2 (two) times daily with a meal for 3 days, THEN 1 tablet (10 mg total) 2 (two) times daily with a meal for 3 days, THEN 1 tablet (10 mg total) daily with breakfast for 3 days. 33 tablet 0  . progesterone (PROMETRIUM) 100 MG capsule Take 100 mg by mouth at bedtime.  12  . SUPER B COMPLEX/C PO Take 1 tablet by mouth daily.    Marland Kitchen UNABLE TO FIND Intestinal support 1 tab prn     Current Facility-Administered Medications  Medication Dose Route Frequency Provider Last Rate Last Admin  . omalizumab Geoffry Paradise) injection 150 mg  150 mg Subcutaneous Q28 days Jessica Priest, MD   150 mg at 10/15/19 0945   Allergies: Allergies  Allergen Reactions  . Other Shortness Of Breath and Cough    BEE STING  . Codeine    I reviewed her past medical history, social history, family history, and environmental history and no  significant changes have been reported from previous visit on 10/14/19.  Review of Systems  Constitutional: Negative.   HENT:       See HPI  Eyes: Negative.   Respiratory:       See HPI  Cardiovascular: Negative.   Gastrointestinal: Negative.   Musculoskeletal: Negative.   Skin: Negative.   Neurological: Negative.    Objective: Physical Exam Not obtained as encounter was done via telephone.   Previous notes and tests were reviewed.  I discussed the assessment and treatment plan with the patient. The patient was provided an opportunity to ask questions and all were answered. The patient agreed with the plan and demonstrated an understanding of the instructions.   The patient was advised to call back or seek an in-person evaluation if the symptoms worsen or if the condition fails to improve as anticipated.  I provided 29 minutes of non-face-to-face time during this encounter.  It was my pleasure to participate in Miyoshi Ligas care today. Please  feel free to contact me with any questions or concerns.   Sincerely,  Kaylie Ritter Larose Hires, MD

## 2020-01-13 NOTE — Telephone Encounter (Signed)
Patient was advise to contact COVID-19 number to schedule an appointment also we added patient to speak with Dr. Delorse Lek on a tele-visit today at 3:30pm.

## 2020-01-13 NOTE — Telephone Encounter (Signed)
Yes with her symptoms I would want to rule out Covid.  Depending on when she was vaccinated we have been seeing breakthrough illness in people fully vaccinated.  If she can get tested today would recommend that.  In the meantime, she can do a televist if she would like.

## 2020-01-14 NOTE — Patient Instructions (Addendum)
Upper respiratory tract infection leading to asthma exacerbation -Covid test is pending at the time of this visit -Advised can continue her over-the-counter medications for supportive care -Given her asthma and increased symptoms in albuterol needs I have prescribed her a prednisone burst -At this time do not believe her symptoms are bacterial in nature to warrant antibiotic -Advised her to use albuterol every 4 hours while awake for the next 2 days then return to as needed use -Advise for her to let us know the results of her Covid test as if positive for an would recommend she receive monoclonal antibody  Asthma -Continue Breo 200--1 puff once a day to prevent cough or wheeze -Continue Spiriva 1.25 mcg-2 puffs once a day to prevent cough or wheeze Continue albuterol 2 puffs every 4 hours as needed for cough or wheeze or instead albuterol 0.083% via nebulizer once every 4 hours as needed for cough or wheeze. -Continue Xolair injections 150 mg once every 4 weeks and have access to an epinephrine autoinjector set  Allergic rhinitis  -Continue allergen avoidance measures directed toward cat, dog, and ragweed as listed below  Allergic conjunctivitis -Some over the counter eye drops include Pataday one drop in each eye once a day as needed for red, itchy eyes OR Zaditor one drop in each eye twice a day as needed for red itchy eyes.  Stinging insect allergy -Continue to avoid stinging insects. In case of an allergic reaction, Benadryl 50 mg every 4 hours, and if life-threatening symptoms occur, inject with AuviQ 0.3 mg.   Follow up in 3 months or sooner if needed.

## 2020-01-15 LAB — NOVEL CORONAVIRUS, NAA: SARS-CoV-2, NAA: NOT DETECTED

## 2020-01-15 LAB — SARS-COV-2, NAA 2 DAY TAT

## 2020-02-01 ENCOUNTER — Ambulatory Visit: Payer: 59 | Admitting: Allergy and Immunology

## 2020-02-04 ENCOUNTER — Ambulatory Visit: Payer: 59 | Admitting: Endocrinology

## 2020-03-07 ENCOUNTER — Other Ambulatory Visit: Payer: Self-pay

## 2020-03-07 ENCOUNTER — Encounter: Payer: Self-pay | Admitting: Allergy and Immunology

## 2020-03-07 ENCOUNTER — Ambulatory Visit: Payer: 59 | Admitting: Allergy and Immunology

## 2020-03-07 VITALS — BP 106/70 | HR 87 | Temp 98.0°F | Resp 18

## 2020-03-07 DIAGNOSIS — Z91038 Other insect allergy status: Secondary | ICD-10-CM

## 2020-03-07 DIAGNOSIS — J4540 Moderate persistent asthma, uncomplicated: Secondary | ICD-10-CM

## 2020-03-07 DIAGNOSIS — J3089 Other allergic rhinitis: Secondary | ICD-10-CM | POA: Diagnosis not present

## 2020-03-07 MED ORDER — VENTOLIN HFA 108 (90 BASE) MCG/ACT IN AERS: 2.0000 | INHALATION_SPRAY | Freq: Four times a day (QID) | RESPIRATORY_TRACT | 1 refills | Status: DC | PRN

## 2020-03-07 NOTE — Patient Instructions (Addendum)
  1.  Continue omalizumab injections  2.  Continue "action plan" including use of Breo 200 - 1 inhalation 1 time per day and Spiriva 1.25 - 2 inhalations 1 time per day and albuterol MDI or nebulization if needed.  3.  Continue OTC antihistamine if needed  4.  Continue Auvi-Q 0.3 mg if needed  5.  Return to clinic in 1 year or earlier if problem

## 2020-03-07 NOTE — Progress Notes (Signed)
[place] [facility] [place] [facility] [place] [facility] [place] [facility] [place]   Follow-up Note  Referring Provider: Merri Brunette, MD Primary Provider: Merri Brunette, MD Date of Office Visit: 03/07/2020  Subjective:   Kelsey Decker (DOB: 1960-12-23) is a 59 y.o. female who [facility] in re-evaluation of the following:  HPI: Kelsey Decker presents to this clinic in reevaluation of asthma and allergic rhinitis and hymenoptera allergy.  Her last contact with this clinic was via a E-med visit with Dr. Delorse Lek on 14 January 2020.  Apparently during that last evaluation she had "bronchitis" and required a systemic steroid at the urgent care center and did quite well after about a week of that treatment.  It appears as though she developed "bronchitis" 1 or 2 times per year while utilizing Xolair .  Her frequency of bronchitis has diminished dramatically ever since she started Xolair.  She was accustomed to receiving systemic steroids 4-6 times per year and now it appears to be only 1-2 times per year.  She only uses her Breo and Spiriva at point in time in which she is having a flareup.  She does use a short acting bronchodilator prior to the performance of exercise but does not need to use it in a rescue mode very often.  She remains away from hymenoptera venom exposure and does have an injectable epinephrine device.  She has had cataract removal this year.  She has received a J&J vaccine and she will not be receiving the flu vaccine.  Allergies as of 03/07/2020      Reactions   Other Shortness Of Breath, Cough   BEE STING   Codeine       Medication List    albuterol (2.5 MG/3ML) 0.083% nebulizer solution Commonly known as: PROVENTIL Take 2.5 mg by nebulization every 6 (six) hours as needed.   albuterol 108 (90 Base) MCG/ACT inhaler Commonly known as: Ventolin HFA Inhale 2 puffs into the lungs every 4 (four) hours as needed for wheezing or shortness  of breath.   Ventolin HFA 108 (90 Base) MCG/ACT inhaler Generic drug: albuterol Inhale 2 puffs into the lungs every 6 (six) hours as needed for wheezing or shortness of breath.   estradiol 0.1 MG/24HR patch Commonly known as: VIVELLE-DOT See admin instructions.   Estring 2 MG vaginal ring Generic drug: estradiol See admin instructions.   ipratropium-albuterol 0.5-2.5 (3) MG/3ML Soln Commonly known as: DUONEB Take 3 mLs by nebulization every 4 (four) hours as needed.   levothyroxine 25 MCG tablet Commonly known as: SYNTHROID 25 mcg daily.   progesterone 100 MG capsule Commonly known as: PROMETRIUM Take 100 mg by mouth at bedtime.   Spiriva Respimat 1.25 MCG/ACT Aers Generic drug: Tiotropium Bromide Monohydrate Inhale 2 puffs into the lungs daily.   SUPER B COMPLEX/C PO Take 1 tablet by mouth daily.   UNABLE TO FIND Intestinal support 1 tab prn   Vitamin D3 125 MCG (5000 UT) Caps Take 2-3 capsules by mouth daily.       Past Medical History:  Diagnosis Date  . Asthma   . Cataract   . Erosive esophagitis   . GERD (gastroesophageal reflux disease)   . Hypothyroidism   . Iron deficiency     Past Surgical History:  Procedure Laterality Date  . ADENOIDECTOMY    . APPENDECTOMY    . CESAREAN SECTION    . PATENT FORAMEN OVALE CLOSURE    . TONSILLECTOMY AND ADENOIDECTOMY  Review of systems negative except as noted in HPI / PMHx or noted below:  Review of Systems  Constitutional: Negative.   HENT: Negative.   Eyes: Negative.   Respiratory: Negative.   Cardiovascular: Negative.   Gastrointestinal: Negative.   Genitourinary: Negative.   Musculoskeletal: Negative.   Skin: Negative.   Neurological: Negative.   Endo/Heme/Allergies: Negative.   Psychiatric/Behavioral: Negative.      Objective:   Vitals:   03/07/20 1638  BP: 106/70  Pulse: 87  Resp: 18  Temp: 98 F (36.7 C)  SpO2: 95%          Physical Exam Constitutional:       Appearance: She is not diaphoretic.  HENT:     Head: Normocephalic.     Right Ear: Tympanic membrane, ear canal and external ear normal.     Left Ear: Tympanic membrane, ear canal and external ear normal.     Nose: Nose normal. No mucosal edema or rhinorrhea.     Mouth/Throat:     Pharynx: Uvula midline. No oropharyngeal exudate.  Eyes:     Conjunctiva/sclera: Conjunctivae normal.  Neck:     Thyroid: No thyromegaly.     Trachea: Trachea normal. No tracheal tenderness or tracheal deviation.  Cardiovascular:     Rate and Rhythm: Normal rate and regular rhythm.     Heart sounds: Normal heart sounds, S1 normal and S2 normal. No murmur heard.   Pulmonary:     Effort: No respiratory distress.     Breath sounds: Normal breath sounds. No stridor. No wheezing or rales.  Lymphadenopathy:     Head:     Right side of head: No tonsillar adenopathy.     Left side of head: No tonsillar adenopathy.     Cervical: No cervical adenopathy.  Skin:    Findings: No erythema or rash.     Nails: There is no clubbing.  Neurological:     Mental Status: She is alert.     Diagnostics:   The patient had an Asthma Control Test with the following results: ACT Total Score: 24.    Assessment and Plan:   1. Asthma, moderate persistent, well-controlled   2. Perennial allergic rhinitis   3. Hymenoptera allergy     1.  Continue omalizumab injections  2.  Continue "action plan" including use of Breo 200 - 1 inhalation 1 time per day and Spiriva 1.25 - 2 inhalations 1 time per day and albuterol MDI or nebulization if needed.  3.  Continue OTC antihistamine if needed  4.  Continue Auvi-Q 0.3 mg if needed  5.  Return to clinic in 1 year or earlier if problem  Terianne appears to be doing okay.  Okay for Kylin means that she still develops a respiratory flareup about 1 or 2 times per year which does not appear to respond to a combination of therapy including Breo and Spiriva and she ends up requiring a  systemic steroid.  Although this is a dramatically improvement  in regard to frequency since she has been using omalizumab injections she still continues to require 1-2 systemic steroid a year.  I have informed Zyiah that if her frequency of flareups increases beyond this number then we need to change her plan.  She needs to obtain a bone densitometry scan given her systemic steroid use and she will discuss this with her primary care doctor.  If she does well I will see her back in this clinic in 1 year or earlier  if there is a problem.  Allena Katz, MD Allergy / Immunology [place]

## 2020-03-13 ENCOUNTER — Encounter: Payer: Self-pay | Admitting: Allergy and Immunology

## 2020-04-05 ENCOUNTER — Other Ambulatory Visit: Payer: Self-pay

## 2020-04-05 ENCOUNTER — Encounter: Payer: Self-pay | Admitting: Plastic Surgery

## 2020-04-05 ENCOUNTER — Ambulatory Visit (INDEPENDENT_AMBULATORY_CARE_PROVIDER_SITE_OTHER): Payer: Self-pay | Admitting: Plastic Surgery

## 2020-04-05 VITALS — BP 113/74 | HR 71 | Temp 98.3°F

## 2020-04-05 DIAGNOSIS — Z411 Encounter for cosmetic surgery: Secondary | ICD-10-CM

## 2020-04-05 NOTE — Progress Notes (Signed)
Patient presents to discuss number of cosmetic concerns. She is interested in Botox treatment for her forehead, glabella and crows feet. She has had this in the past and is like there is a result. She is also interested in filler injection and nasolabial folds in her lips in a conservative fashion. She has had to brow lift in the past and claims that both of them have failed to give her a durable result. She is also had a mini facelift that she said helped at the time but feels like the result has deteriorated over the past 10 years. She is also bothered by the platysmal banding and skin laxity in the neck area. She would like to discuss surgical treatment of these areas. On exam she has dynamic and static lines in the forehead, glabella and crows feet. She has some static lines around the upper lip as well. She does have the transition between the cheek and upper lip giving her prominent nasolabial fold. There is mild thinning of her upper and lower lip. Cranial nerves seem to be grossly intact. She has mild to moderate skin laxity and descends in the jowl and neck area. There is obvious platysmal bands anteriorly.  We discussed the risks and benefits of filler and neuromodulator injection. We discussed that these risks include bleeding, infection, damage to surrounding structures and need for additional procedures. She is in agreement and wanted to proceed. The face was prepped with an alcohol pad and 36 units of Botox were distributed along the forehead, glabella and crows feet. 1 cc of Restylane define was injected into the nasolabial folds and a little bit placed in the marionette lines to smooth out the jowl transition. 0.55 cc of Juvderm Voll Bella was placed fairly evenly throughout the upper and lower lip which gave her the desired correction.  I discussed face and neck lift with her in detail. I explained the details of the procedures and the incisions that would be done. I explained the risks and  benefits of this procedure in detail as well. Additionally for the fine lines on her face that are static I suggested fractional laser resurfacing which she is interested in. She also would like to discuss a breast lift and while I did not get a chance to examine her breasts today we can certainly provide a estimated quote for her and I be happy to discuss this with her again in the future with a more thorough exam.

## 2020-08-26 ENCOUNTER — Telehealth: Payer: Self-pay | Admitting: Allergy & Immunology

## 2020-08-26 DIAGNOSIS — R059 Cough, unspecified: Secondary | ICD-10-CM

## 2020-08-26 NOTE — Telephone Encounter (Signed)
Patient contacted Dr. Delorse Lek to report that she contracted COVID19. This is within 5 days of symptom onset. She is largely doing well with some coughing and congestion. She recommended doing her Breo and antipyretics alternating. She also called in Paxlovid course to [facility] on [address].   Strict precautions provided on when to seek further care.   Malachi Bonds, MD [facility]

## 2020-08-28 ENCOUNTER — Other Ambulatory Visit: Payer: Self-pay | Admitting: Physician Assistant

## 2020-08-28 DIAGNOSIS — E663 Overweight: Secondary | ICD-10-CM

## 2020-08-28 DIAGNOSIS — J4541 Moderate persistent asthma with (acute) exacerbation: Secondary | ICD-10-CM

## 2020-08-28 DIAGNOSIS — U071 COVID-19: Secondary | ICD-10-CM

## 2020-08-28 NOTE — Progress Notes (Signed)
I connected by phone with Kelsey Decker on 08/28/2020 at 9:24 AM to discuss the potential use of a new treatment for mild to moderate COVID-19 viral infection in non-hospitalized patients.  This patient is a 60 y.o. female that meets the FDA criteria for Emergency Use Authorization of COVID monoclonal antibody bebtelovimab.  Has a (+) direct SARS-CoV-2 viral test result  Has mild or moderate COVID-19   Is NOT hospitalized due to COVID-19  Is within 10 days of symptom onset  Has at least one of the high risk factor(s) for progression to severe COVID-19 and/or hospitalization as defined in EUA.  Specific high risk criteria : BMI > 25 and Chronic Lung Disease   I have spoken and communicated the following to the patient or parent/caregiver regarding COVID monoclonal antibody treatment:  1. FDA has authorized the emergency use for the treatment of mild to moderate COVID-19 in adults and pediatric patients with positive results of direct SARS-CoV-2 viral testing who are 69 years of age and older weighing at least 40 kg, and who are at high risk for progressing to severe COVID-19 and/or hospitalization.  2. The significant known and potential risks and benefits of COVID monoclonal antibody, and the extent to which such potential risks and benefits are unknown.  3. Information on available alternative treatments and the risks and benefits of those alternatives, including clinical trials.  4. Patients treated with COVID monoclonal antibody should continue to self-isolate and use infection control measures (e.g., wear mask, isolate, social distance, avoid sharing personal items, clean and disinfect "high touch" surfaces, and frequent handwashing) according to CDC guidelines.   5. The patient or parent/caregiver has the option to accept or refuse COVID monoclonal antibody treatment.  6. Discussion about the monoclonal antibody infusion does not ensure treatment. The patient will be placed on  a list and scheduled according to risk, symptom onset and availability. A scheduler will reach to the patient to let them know if we can accommodate their infusion or not.  After reviewing this information with the patient, the patient has agreed to receive one of the available covid 19 monoclonal antibodies and will be provided an appropriate fact sheet prior to infusion.   Patient has Paxlovid Rx but not started yet as she prefers MAB. Discussed staff will call if availability of MAB. Recommended to go ahead and start taking Paxlovid.    [place], [place] 08/28/2020 9:24 AM

## 2020-08-28 NOTE — Telephone Encounter (Signed)
Pt call as per Dr. Ellouise Newer documention.   She did report other symptoms including headache, fever, shortness of breath, coughing, fatigue.  She has history of asthma but states was under control and was no longer taking a maintenance controller inhaler but she did have Breo at her house.  I advised that she start this as well as taking albuterol every 4 hours while awake for the next 1 to 2 days then return to as needed use.  Advise she can alternate between Tylenol and ibuprofen for fever and pain control.  Urged hydration and adequate rest.  She inquired about the antiviral medication and advised that she could do Paxlovid as she was within the window for this medication.  I sent this to her pharmacy.  She does not have any history of kidney disease.  She states she traveled to [place] and flew back on Friday when she started to become symptomatic.  She states she had taken several COVID test prior to her travel that were negative but the test on Friday was positive.

## 2020-08-29 ENCOUNTER — Telehealth: Payer: Self-pay

## 2020-08-29 NOTE — Telephone Encounter (Signed)
Reviewed MAB Estimate of $1050 with patient.  Stated they would like to proceed.   

## 2020-08-30 ENCOUNTER — Telehealth: Payer: Self-pay

## 2020-08-30 ENCOUNTER — Ambulatory Visit (INDEPENDENT_AMBULATORY_CARE_PROVIDER_SITE_OTHER): Payer: 59

## 2020-08-30 DIAGNOSIS — U071 COVID-19: Secondary | ICD-10-CM | POA: Diagnosis not present

## 2020-08-30 MED ORDER — EPINEPHRINE 0.3 MG/0.3ML IJ SOAJ: 0.3000 mg | Freq: Once | INTRAMUSCULAR | Status: AC | PRN

## 2020-08-30 MED ORDER — FAMOTIDINE IN NACL 20-0.9 MG/50ML-% IV SOLN: 20.0000 mg | Freq: Once | INTRAVENOUS | Status: AC | PRN

## 2020-08-30 MED ORDER — METHYLPREDNISOLONE SODIUM SUCC 125 MG IJ SOLR: 125.0000 mg | Freq: Once | INTRAMUSCULAR | Status: AC | PRN

## 2020-08-30 MED ORDER — BEBTELOVIMAB 175 MG/2 ML IV (EUA)
175.0000 mg | Freq: Once | INTRAMUSCULAR | Status: AC
  Administered 2020-08-30: 175 mg via INTRAVENOUS

## 2020-08-30 MED ORDER — DIPHENHYDRAMINE HCL 50 MG/ML IJ SOLN: 50.0000 mg | Freq: Once | INTRAMUSCULAR | Status: AC | PRN

## 2020-08-30 MED ORDER — ALBUTEROL SULFATE HFA 108 (90 BASE) MCG/ACT IN AERS: 2.0000 | INHALATION_SPRAY | Freq: Once | RESPIRATORY_TRACT | Status: AC | PRN

## 2020-08-30 MED ORDER — SODIUM CHLORIDE 0.9 % IV BOLUS
1000.0000 mL | Freq: Once | INTRAVENOUS | Status: AC
  Administered 2020-08-30: 1000 mL via INTRAVENOUS
  Filled 2020-08-30: qty 1000

## 2020-08-30 MED ORDER — SODIUM CHLORIDE 0.9 % IV SOLN: INTRAVENOUS | Status: DC | PRN

## 2020-08-30 NOTE — Progress Notes (Signed)
Diagnosis: COVID  Provider:  Chilton Greathouse, MD  Procedure: Infusion  IV Type: Peripheral, IV Location: R Antecubital  Bebtelovimab, Dose: 175 mg, IVF   Infusion Start Time: 1344  Infusion Stop Time: 1345  Post Infusion IV Care: Observation period completed, IV discontinued  Discharge: Condition: Good, Destination: Home . AVS provided to patient.   Performed by:  Essie Hart, RN

## 2020-08-30 NOTE — Telephone Encounter (Signed)
Reviewed MAB Estimate of $1050 with patient.  Stated they would like to proceed.   

## 2020-08-30 NOTE — Patient Instructions (Signed)
10 Things You Can Do to Manage Your COVID-19 Symptoms at Home If you have possible or confirmed COVID-19: 1. Stay home except to get medical care. 2. Monitor your symptoms carefully. If your symptoms get worse, call your healthcare provider immediately. 3. Get rest and stay hydrated. 4. If you have a medical appointment, call the healthcare provider ahead of time and tell them that you have or may have COVID-19. 5. For medical emergencies, call 911 and notify the dispatch personnel that you have or may have COVID-19. 6. Cover your cough and sneezes with a tissue or use the inside of your elbow. 7. Wash your hands often with soap and water for at least 20 seconds or clean your hands with an alcohol-based hand sanitizer that contains at least 60% alcohol. 8. As much as possible, stay in a specific room and away from other people in your home. Also, you should use a separate bathroom, if available. If you need to be around other people in or outside of the home, wear a mask. 9. Avoid sharing personal items with other people in your household, like dishes, towels, and bedding. 10. Clean all surfaces that are touched often, like counters, tabletops, and doorknobs. Use household cleaning sprays or wipes according to the label instructions. cdc.gov/coronavirus 11/05/2019 This information is not intended to replace advice given to you by your health care provider. Make sure you discuss any questions you have with your health care provider. Document Revised: 02/21/2020 Document Reviewed: 02/21/2020 Elsevier Patient Education  2021 Elsevier Inc.  What types of side effects do monoclonal antibody drugs cause?  Common side effects  In general, the more common side effects caused by monoclonal antibody drugs include: . Allergic reactions, such as hives or itching . Flu-like signs and symptoms, including chills, fatigue, fever, and muscle aches and pains . Nausea, vomiting . Diarrhea . Skin  rashes . Low blood pressure   The CDC is recommending patients who receive monoclonal antibody treatments wait at least 90 days before being vaccinated.  Currently, there are no data on the safety and efficacy of mRNA COVID-19 vaccines in persons who received monoclonal antibodies or convalescent plasma as part of COVID-19 treatment. Based on the estimated half-life of such therapies as well as evidence suggesting that reinfection is uncommon in the 90 days after initial infection, vaccination should be deferred for at least 90 days, as a precautionary measure until additional information becomes available, to avoid interference of the antibody treatment with vaccine-induced immune responses.   If someone you know is interested in receiving treatment please have them contact their MD for a referral or visit [email or web address]    

## 2020-09-06 ENCOUNTER — Other Ambulatory Visit: Payer: Self-pay

## 2020-09-06 MED ORDER — PREDNISONE 10 MG PO TABS: 10.0000 mg | ORAL_TABLET | Freq: Every day | ORAL | 0 refills | Status: DC

## 2020-09-06 NOTE — Telephone Encounter (Signed)
Prednisone sent in and pt informed and stated understanding

## 2020-09-06 NOTE — Telephone Encounter (Signed)
Pt is still having a lot of congestion. She did the mononucular (sp) antibodies. She is testing negative but your husband if testing positive. Pt is doing nebulizer, breo, and inhalers, no antihistamine, no nasal sprays, no nasal saline, she is doing steam inhalation. She is requesting prednisone.

## 2020-09-06 NOTE — Telephone Encounter (Signed)
Please direct this to Dr. Lucie Leather.   I was the on call physician when she called over a weekend with a new a covid diagnosis.

## 2020-09-06 NOTE — Telephone Encounter (Signed)
Pt is still having a lot of congestion. She did the mononucular (sp) antibodies. She is testing negative but your husband if testing positive. Pt is doing nebulizer, breo, and inhalers, no antihistamine, no nasal sprays, no nasal saline, she is doing steam inhalation. She is requesting prednisone.   

## 2020-09-06 NOTE — Telephone Encounter (Signed)
Please inform Kelsey Decker that we can have her use prednisone 10 mg - 1 tablet 1 time per day for 10 days and she can come see [place] if she is having difficulty.

## 2020-09-06 NOTE — Telephone Encounter (Signed)
Patient states she would like a call back from a nurse in regards to COVID, patient did not explain further.  Best number to reach patient: [phone].  Please advise.

## 2020-10-03 NOTE — Progress Notes (Signed)
Diagnosis: COVID  Provider:  Praveen Mannam, MD  Procedure: Infusion  IV Type: Peripheral, IV Location: R Antecubital  Bebtelovimab, Dose: 175 mg, IVF   Infusion Start Time: 1344  Infusion Stop Time: 1345  Post Infusion IV Care: Observation period completed, IV discontinued  Discharge: Condition: Good, Destination: Home . AVS provided to patient.   Performed by:  Mary Bastin, RN        

## 2020-10-11 NOTE — Telephone Encounter (Signed)
Order has been placed for chest x ray at [facility] and her appointment has been moved to this coming Tuesday in a double book slot for her to see you in the [place] office. Patient is aware and will go to get the Chest x ray done this coming Friday.

## 2020-10-11 NOTE — Addendum Note (Signed)
Addended by: Dollene Cleveland R on: 10/11/2020 03:06 PM   Modules accepted: Orders

## 2020-10-11 NOTE — Telephone Encounter (Signed)
Patient is still having breathing issues/ feels it is in her lungs, post covid. Patient scheduled an appointment 10/26/2020 in [place] with Dr. Lucie Leather, but like to know if a chest x-ray can be ordered before so Dr. Lucie Leather can review it at her appointment.  Please advise on order.

## 2020-10-11 NOTE — Telephone Encounter (Signed)
Please have Kelsey Decker obtain a chest x-ray for post COVID pulmonary morbidity and have her see me in clinic next week.

## 2020-10-12 ENCOUNTER — Ambulatory Visit
Admission: RE | Admit: 2020-10-12 | Discharge: 2020-10-12 | Disposition: A | Discharge status: Home / Self Care | Payer: 59 | Source: Ambulatory Visit | Attending: Allergy and Immunology | Admitting: Allergy and Immunology

## 2020-10-12 ENCOUNTER — Other Ambulatory Visit: Payer: Self-pay

## 2020-10-17 ENCOUNTER — Other Ambulatory Visit: Payer: Self-pay

## 2020-10-17 ENCOUNTER — Ambulatory Visit: Payer: 59 | Admitting: Allergy and Immunology

## 2020-10-17 VITALS — BP 102/70 | HR 83 | Temp 97.9°F | Resp 16 | Ht 63.0 in | Wt 156.8 lb

## 2020-10-17 DIAGNOSIS — J3089 Other allergic rhinitis: Secondary | ICD-10-CM | POA: Diagnosis not present

## 2020-10-17 DIAGNOSIS — J4540 Moderate persistent asthma, uncomplicated: Secondary | ICD-10-CM | POA: Diagnosis not present

## 2020-10-17 DIAGNOSIS — U099 Post covid-19 condition, unspecified: Secondary | ICD-10-CM | POA: Diagnosis not present

## 2020-10-17 DIAGNOSIS — Z91038 Other insect allergy status: Secondary | ICD-10-CM | POA: Diagnosis not present

## 2020-10-17 MED ORDER — SPIRIVA RESPIMAT 1.25 MCG/ACT IN AERS: 2.0000 | INHALATION_SPRAY | Freq: Every day | RESPIRATORY_TRACT | 1 refills | Status: DC

## 2020-10-17 MED ORDER — SPIRIVA RESPIMAT 1.25 MCG/ACT IN AERS
2.0000 | INHALATION_SPRAY | Freq: Every day | RESPIRATORY_TRACT | 5 refills | Status: DC
Start: 2020-10-17 — End: 2022-06-17

## 2020-10-17 MED ORDER — TRIAMCINOLONE ACETONIDE 55 MCG/ACT NA AERO: 2.0000 | INHALATION_SPRAY | Freq: Every day | NASAL | 5 refills | Status: DC

## 2020-10-17 MED ORDER — VENTOLIN HFA 108 (90 BASE) MCG/ACT IN AERS: 2.0000 | INHALATION_SPRAY | Freq: Four times a day (QID) | RESPIRATORY_TRACT | 1 refills | Status: DC | PRN

## 2020-10-17 MED ORDER — ALBUTEROL SULFATE HFA 108 (90 BASE) MCG/ACT IN AERS
2.0000 | INHALATION_SPRAY | RESPIRATORY_TRACT | 1 refills | Status: DC | PRN
Start: 2020-10-17 — End: 2022-06-17

## 2020-10-17 MED ORDER — IPRATROPIUM-ALBUTEROL 0.5-2.5 (3) MG/3ML IN SOLN: 3.0000 mL | RESPIRATORY_TRACT | 1 refills | Status: DC | PRN

## 2020-10-17 NOTE — Progress Notes (Signed)
- [place] - [place] - [place] - [place]   Follow-up Note  Referring Provider: Merri Brunette, MD Primary Provider: Merri Brunette, MD Date of Office Visit: 10/17/2020  Subjective:   Kelsey Decker (DOB: [birth year]) is a 60 y.o. 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  She is followed in this clinic for asthma and allergic rhinitis and history of hymenoptera venom allergy.  Her last visit to this clinic was 07 March 2020.  She was doing wonderful regarding her airway issue while using omalizumab and a collection of other anti-inflammatory agents for her airway until she contracted COVID on 25 Aug 2020 treated with low-dose systemic steroid and bebtelovimab.  Since that event she has been left with very low energy and fatigue and shortness of breath and occasionally some intermittent unsteadiness and her taste is off.  She does not have any chronic cough or anosmia or sputum production or ugly nasal discharge or chest pain.  She still performs at [employer] although her performance is definitely down from what it was previous to COVID.  She has been using a short acting bronchodilator on pretty much a daily basis.  She can sleep with no problem.  She continues to use Breo but is not using her Spiriva.  Allergies as of 10/17/2020       Reactions   Other Shortness Of Breath, Cough   BEE STING   Codeine         Medication List      albuterol (2.5 MG/3ML) 0.083% nebulizer solution Commonly known as: PROVENTIL Take 2.5 mg by nebulization every 6 (six) hours as needed.   albuterol 108 (90 Base) MCG/ACT inhaler Commonly known as: Ventolin HFA Inhale 2 puffs into the lungs every 4 (four) hours as needed for wheezing or shortness of breath.   Ventolin HFA 108 (90 Base) MCG/ACT inhaler Generic drug: albuterol Inhale 2 puffs into  the lungs every 6 (six) hours as needed for wheezing or shortness of breath.   estradiol 0.1 MG/24HR patch Commonly known as: VIVELLE-DOT See admin instructions.   Estring 2 MG vaginal ring Generic drug: estradiol See admin instructions.   ipratropium-albuterol 0.5-2.5 (3) MG/3ML Soln Commonly known as: DUONEB Take 3 mLs by nebulization every 4 (four) hours as needed.   levothyroxine 25 MCG tablet Commonly known as: SYNTHROID 25 mcg daily.   progesterone 100 MG capsule Commonly known as: PROMETRIUM Take 100 mg by mouth at bedtime.   Spiriva Respimat 1.25 MCG/ACT Aers Generic drug: Tiotropium Bromide Monohydrate Inhale 2 puffs into the lungs daily.   SUPER B COMPLEX/C PO Take 1 tablet by mouth daily.   UNABLE TO FIND Intestinal support 1 tab prn   Vitamin D3 125 MCG (5000 UT) Caps Take 2-3 capsules by mouth daily.        Past Medical History:  Diagnosis Date   Asthma    Cataract    Erosive esophagitis    GERD (gastroesophageal reflux disease)    Hypothyroidism    Iron deficiency     Past Surgical History:  Procedure Laterality Date   ADENOIDECTOMY     APPENDECTOMY     CESAREAN SECTION     PATENT FORAMEN OVALE CLOSURE     TONSILLECTOMY AND ADENOIDECTOMY      Review of systems negative except as noted in HPI / PMHx or noted below:  Review  of Systems  Constitutional: Negative.   HENT: Negative.    Eyes: Negative.   Respiratory: Negative.    Cardiovascular: Negative.   Gastrointestinal: Negative.   Genitourinary: Negative.   Musculoskeletal: Negative.   Skin: Negative.   Neurological: Negative.   Endo/Heme/Allergies: Negative.   Psychiatric/Behavioral: Negative.      Objective:   Vitals:   10/17/20 1201  BP: 102/70  Pulse: 83  Resp: 16  Temp: 97.9 F (36.6 C)  SpO2: 95%   Height: 5\' 3"  (160 cm)  Weight: 156 lb 12.8 oz (71.1 kg)   Physical Exam  Diagnostics:    Spirometry was performed and demonstrated an FEV1 of 1.70 at 71 % of  predicted.  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 Not well controlled moderate persistent asthma   3. Perennial allergic rhinitis   4. Hymenoptera allergy     1.  Continue omalizumab injections  2.  Continue Breo 200 - 1 inhalation 1 time per day   3.  Restart Spiriva 1.25 - 2 inhalations 1 time per day   4.  Start OTC Nasacort - 1 spray each nostril 1 time per day  5.  Continue Albuterol MDI or nebulization if needed.  6.  Continue OTC antihistamine if needed  7.  Continue Auvi-Q 0.3 mg if needed  8. Continue to engage in consistent exercise program  9.  Return to clinic in 1 month or earlier if problem. Taper medications???  Kelsey Decker obviously sustained a rather significant insult to her body during COVID and I am going to have her restart a little bit more aggressive therapy directed against inflammation of both her upper and lower airway as noted above.  I have encouraged her to continue to engage in a consistent exercise program.  Hopefully in time with lots of anti-inflammatory agents for her airway she will begin to resolve her lingering dyspnea and her dysgeusia.  I will see her back in this clinic in 1 month or earlier if there is a problem.  Darl Pikes, MD Allergy / Immunology [facility]

## 2020-10-17 NOTE — Patient Instructions (Signed)
  1.  Continue omalizumab injections  2.  Continue Breo 200 - 1 inhalation 1 time per day   3.  Restart Spiriva 1.25 - 2 inhalations 1 time per day   4.  Start OTC Nasacort - 1 spray each nostril 1 time per day  5.  Continue Albuterol MDI or nebulization if needed.  6.  Continue OTC antihistamine if needed  7.  Continue Auvi-Q 0.3 mg if needed  8. Continue to engage in consistent exercise program  9.  Return to clinic in 1 month or earlier if problem. Taper medications???

## 2020-10-18 ENCOUNTER — Encounter: Payer: Self-pay | Admitting: Allergy and Immunology

## 2020-10-26 ENCOUNTER — Ambulatory Visit: Payer: 59 | Admitting: Allergy and Immunology

## 2020-10-26 ENCOUNTER — Other Ambulatory Visit: Payer: Self-pay | Admitting: Family Medicine

## 2020-10-27 ENCOUNTER — Telehealth: Payer: Self-pay | Admitting: *Deleted

## 2020-10-27 NOTE — Telephone Encounter (Signed)
Called patient to go over lab results and she mentioned that she was having issues with getting her Xolair from the pharmacy. They stated that she needs a new prescription from our office. She was seen last month with Dr. Lucie Leather. She receives her Xolair injections and home and she is already 8 days behind her injection. Would it be ok to provide her with a sample to pick up until then?

## 2020-11-01 NOTE — Telephone Encounter (Signed)
Advised patient Rx refill for Xolair sent in to [facility]

## 2020-11-06 ENCOUNTER — Other Ambulatory Visit: Payer: Self-pay | Admitting: *Deleted

## 2020-11-06 MED ORDER — OMALIZUMAB 150 MG/ML ~~LOC~~ SOSY: PREFILLED_SYRINGE | SUBCUTANEOUS | 11 refills | Status: DC

## 2020-11-21 ENCOUNTER — Ambulatory Visit: Payer: 59 | Admitting: Allergy and Immunology

## 2021-01-02 ENCOUNTER — Ambulatory Visit: Payer: 59 | Admitting: Allergy and Immunology

## 2021-02-27 ENCOUNTER — Ambulatory Visit: Payer: 59 | Admitting: Allergy and Immunology

## 2021-03-22 ENCOUNTER — Telehealth: Payer: Self-pay | Admitting: *Deleted

## 2021-03-22 NOTE — Telephone Encounter (Signed)
Patient had questions regarding her Xolair and called her back and advised her I would look into issue with her Xolair and formulary

## 2021-04-24 ENCOUNTER — Encounter: Payer: Self-pay | Admitting: Allergy and Immunology

## 2021-04-24 ENCOUNTER — Other Ambulatory Visit: Payer: Self-pay

## 2021-04-24 ENCOUNTER — Ambulatory Visit: Payer: 59 | Admitting: Allergy and Immunology

## 2021-04-24 ENCOUNTER — Other Ambulatory Visit: Payer: Self-pay | Admitting: Allergy and Immunology

## 2021-04-24 VITALS — BP 110/70 | HR 68 | Temp 97.9°F | Ht 63.0 in | Wt 160.2 lb

## 2021-04-24 DIAGNOSIS — Z91038 Other insect allergy status: Secondary | ICD-10-CM

## 2021-04-24 DIAGNOSIS — J3089 Other allergic rhinitis: Secondary | ICD-10-CM | POA: Diagnosis not present

## 2021-04-24 DIAGNOSIS — J4540 Moderate persistent asthma, uncomplicated: Secondary | ICD-10-CM | POA: Diagnosis not present

## 2021-04-24 MED ORDER — ALBUTEROL SULFATE (2.5 MG/3ML) 0.083% IN NEBU: 2.5000 mg | INHALATION_SOLUTION | Freq: Four times a day (QID) | RESPIRATORY_TRACT | 2 refills | Status: AC | PRN

## 2021-04-24 MED ORDER — IPRATROPIUM-ALBUTEROL 0.5-2.5 (3) MG/3ML IN SOLN: 3.0000 mL | RESPIRATORY_TRACT | 1 refills | Status: DC | PRN

## 2021-04-24 MED ORDER — FLUTICASONE FUROATE-VILANTEROL 200-25 MCG/ACT IN AEPB: 1.0000 | INHALATION_SPRAY | Freq: Every day | RESPIRATORY_TRACT | 11 refills | Status: DC

## 2021-04-24 MED ORDER — EPINEPHRINE 0.3 MG/0.3ML IJ SOAJ: 0.3000 mg | Freq: Once | INTRAMUSCULAR | 1 refills | Status: AC

## 2021-04-24 MED ORDER — VENTOLIN HFA 108 (90 BASE) MCG/ACT IN AERS: 2.0000 | INHALATION_SPRAY | Freq: Four times a day (QID) | RESPIRATORY_TRACT | 2 refills | Status: DC | PRN

## 2021-04-24 MED ORDER — TRIAMCINOLONE ACETONIDE 55 MCG/ACT NA AERO: 2.0000 | INHALATION_SPRAY | Freq: Every day | NASAL | 11 refills | Status: AC

## 2021-04-24 NOTE — Progress Notes (Signed)
[place] - [place] - [place] - [place] - [place]   Follow-up Note  Referring Provider: Merri Brunette, MD Primary Provider: Merri Brunette, MD Date of Office Visit: 04/24/2021  Subjective:   Kelsey Decker (DOB: [birth year]) is a 61 y.o. female who [facility] in re-evaluation of the following:  HPI: Kelsey Decker returns to this clinic in evaluation of asthma, allergic rhinitis, history of hymenoptera venom allergy, and history of post COVID syndrome.  Her last visit to this clinic was 17 October 2020.  Overall she is really doing very well regarding her asthma.  She has started pickleball and she is doing Pilates.  She would use a short acting bronchodilator prior to the performance of this exercise.  She continues to use Breo and omalizumab as her controller agents.  She has had very little issues with her upper airway and does not use any nasal steroids.  She remains away from hymenoptera venom exposure and has an injectable epinephrine device.  Her post COVID syndrome appears to have resolved completely.  Allergies as of 04/24/2021       Reactions   Other Shortness Of Breath, Cough   BEE STING   Codeine         Medication List    albuterol (2.5 MG/3ML) 0.083% nebulizer solution Commonly known as: PROVENTIL Take 2.5 mg by nebulization every 6 (six) hours as needed.   Ventolin HFA 108 (90 Base) MCG/ACT inhaler Generic drug: albuterol Inhale 2 puffs into the lungs every 6 (six) hours as needed for wheezing or shortness of breath.   albuterol 108 (90 Base) MCG/ACT inhaler Commonly known as: Ventolin HFA Inhale 2 puffs into the lungs every 4 (four) hours as needed for wheezing or shortness of breath.   estradiol 0.1 MG/24HR patch Commonly known as: VIVELLE-DOT See admin instructions.   Estring 2 MG vaginal ring Generic drug: estradiol See admin instructions.   ipratropium-albuterol 0.5-2.5 (3) MG/3ML  Soln Commonly known as: DUONEB Take 3 mLs by nebulization every 4 (four) hours as needed.   levothyroxine 25 MCG tablet Commonly known as: SYNTHROID 25 mcg daily.   omalizumab 150 MG/ML prefilled syringe Commonly known as: Xolair INJECT 150MG  SUBCUTANEOUSLY EVERY 4 WEEKS   progesterone 100 MG capsule Commonly known as: PROMETRIUM Take 100 mg by mouth at bedtime.   Spiriva Respimat 1.25 MCG/ACT Aers Generic drug: Tiotropium Bromide Monohydrate Inhale 2 puffs into the lungs daily.   Spiriva Respimat 1.25 MCG/ACT Aers Generic drug: Tiotropium Bromide Monohydrate Inhale 2 puffs into the lungs daily.   SUPER B COMPLEX/C PO Take 1 tablet by mouth daily.   triamcinolone 55 MCG/ACT Aero nasal inhaler Commonly known as: NASACORT Place 2 sprays into the nose daily.   UNABLE TO FIND Intestinal support 1 tab prn   Vitamin D3 125 MCG (5000 UT) Caps Take 2-3 capsules by mouth daily.    Past Medical History:  Diagnosis Date   Asthma    Cataract    Erosive esophagitis    GERD (gastroesophageal reflux disease)    Hypothyroidism    Iron deficiency     Past Surgical History:  Procedure Laterality Date   ADENOIDECTOMY     APPENDECTOMY     CESAREAN SECTION     PATENT FORAMEN OVALE CLOSURE     TONSILLECTOMY AND ADENOIDECTOMY      Review of systems negative except as noted in HPI / PMHx or noted below:  Review of Systems  Constitutional: Negative.  HENT: Negative.    Eyes: Negative.   Respiratory: Negative.    Cardiovascular: Negative.   Gastrointestinal: Negative.   Genitourinary: Negative.   Musculoskeletal: Negative.   Skin: Negative.   Neurological: Negative.   Endo/Heme/Allergies: Negative.   Psychiatric/Behavioral: Negative.      Objective:   Vitals:   04/24/21 1629  BP: 110/70  Pulse: 68  Temp: 97.9 F (36.6 C)  SpO2: 98%   Height: 5\' 3"  (160 cm)  Weight: 160 lb 3.2 oz (72.7 kg)   Physical Exam Constitutional:      Appearance: She is not  diaphoretic.  HENT:     Head: Normocephalic.     Right Ear: Tympanic membrane, ear canal and external ear normal.     Left Ear: Tympanic membrane, ear canal and external ear normal.     Nose: Nose normal. No mucosal edema or rhinorrhea.     Mouth/Throat:     Pharynx: Uvula midline. No oropharyngeal exudate.  Eyes:     Conjunctiva/sclera: Conjunctivae normal.  Neck:     Thyroid: No thyromegaly.     Trachea: Trachea normal. No tracheal tenderness or tracheal deviation.  Cardiovascular:     Rate and Rhythm: Normal rate and regular rhythm.     Heart sounds: Normal heart sounds, S1 normal and S2 normal. No murmur heard. Pulmonary:     Effort: No respiratory distress.     Breath sounds: Normal breath sounds. No stridor. No wheezing or rales.  Lymphadenopathy:     Head:     Right side of head: No tonsillar adenopathy.     Left side of head: No tonsillar adenopathy.     Cervical: No cervical adenopathy.  Skin:    Findings: No erythema or rash.     Nails: There is no clubbing.  Neurological:     Mental Status: She is alert.    Diagnostics:    Spirometry was performed and demonstrated an FEV1 of 1.52 at 65 % of predicted.  Assessment and Plan:   1. Asthma, moderate persistent, well-controlled   2. Perennial allergic rhinitis   3. Hymenoptera allergy     1.  Continue omalizumab injections  2.  Continue Breo 200 - 1 inhalation 1 time per day   3.  Continue Albuterol MDI or nebulization if needed.  4.  Continue OTC antihistamine if needed  5.  Continue Auvi-Q 0.3 mg if needed  6. Engage in consistent exercise program  7. Return to clinic in 1 year or earlier if problem  Overall Kelsey Decker is doing relatively well and she will continue to use a collection of anti-inflammatory medications including the use of omalizumab and Breo to address her atopic respiratory disease.  I have encouraged her to engage in a consistent exercise program as this is the 1 nonpharmacological  manipulation she can make that is going to help her breathing in the long run.  She has a good understanding of her disease state and how her medications work and appropriate dosing of her medications and I will now see her back in this clinic in 1 year or earlier if there is a problem.  Darl Pikes, MD Allergy / Immunology [facility]

## 2021-04-24 NOTE — Patient Instructions (Addendum)
°  1.  Continue omalizumab injections  2.  Continue Breo 200 - 1 inhalation 1 time per day   3.  Continue Albuterol MDI or nebulization if needed.  4.  Continue OTC antihistamine if needed  5.  Continue Auvi-Q 0.3 mg if needed  6. Engage in consistent exercise program  7. Return to clinic in 1 year or earlier if problem

## 2021-04-25 ENCOUNTER — Other Ambulatory Visit: Payer: Self-pay | Admitting: Allergy and Immunology

## 2021-04-25 ENCOUNTER — Encounter: Payer: Self-pay | Admitting: Allergy and Immunology

## 2021-04-25 ENCOUNTER — Other Ambulatory Visit: Payer: Self-pay | Admitting: *Deleted

## 2021-04-25 MED ORDER — ARMONAIR DIGIHALER 113 MCG/ACT IN AEPB: 1.0000 | INHALATION_SPRAY | Freq: Two times a day (BID) | RESPIRATORY_TRACT | 5 refills | Status: DC

## 2021-04-25 NOTE — Telephone Encounter (Signed)
Please let me know what the insurance company will allow for alternatives for [facility]

## 2021-04-25 NOTE — Telephone Encounter (Signed)
Can we get her ArmonAir 113 - 1 inhalation 2 times per day through the specialty pharmacy?

## 2021-04-25 NOTE — Telephone Encounter (Signed)
Advair and symbicort is a tier 3 and dulera is a tier 4 so all will be costly for pt

## 2021-04-25 NOTE — Telephone Encounter (Signed)
New prescription has been sent to [facility] with a coupon attached. Called patient and advised of change in inhlaer. Patient verbalized understanding.

## 2021-05-17 ENCOUNTER — Telehealth: Payer: Self-pay | Admitting: Allergy and Immunology

## 2021-05-17 NOTE — Telephone Encounter (Signed)
Patient called and said that we have sent a rx for Auvi-q to one of the pharmacy and she has paid for it and she call them and they said they had not order.[phone].

## 2021-05-17 NOTE — Telephone Encounter (Signed)
Called and spoke with patient and provided the phone number for [facility], she stated that they took her money and did not ship the medication to her. She is going to call them and will call back if she needs anything further. She seemed very frustrated that her Kelsey Decker went to [place] went to [place]. She requests that in the future we just send medications to her local pharmacy.

## 2021-08-06 ENCOUNTER — Encounter: Payer: Self-pay | Admitting: Plastic Surgery

## 2021-08-06 ENCOUNTER — Ambulatory Visit (INDEPENDENT_AMBULATORY_CARE_PROVIDER_SITE_OTHER): Payer: Self-pay | Admitting: Plastic Surgery

## 2021-08-06 DIAGNOSIS — Z719 Counseling, unspecified: Secondary | ICD-10-CM

## 2021-08-06 NOTE — Progress Notes (Signed)
Botulinum Toxin Procedure Note ? ?Procedure: Cosmetic botulinum toxin  ? ?Pre-operative Diagnosis: Dynamic rhytides ? ?Post-operative Diagnosis: Same ? ?Complications:  None ? ?Brief history: The patient desires botulinum toxin injection of her forehead. I discussed with the patient this proposed procedure of botulinum toxin injections, which is customized depending on the particular needs of the patient. It is performed on facial rhytids as a temporary correction. The alternatives were discussed with the patient. The risks were addressed including bleeding, scarring, infection, damage to deeper structures, asymmetry, and chronic pain, which may occur infrequently after a procedure. The individual's choice to undergo a surgical procedure is based on the comparison of risks to potential benefits. Other risks include unsatisfactory results, brow ptosis, eyelid ptosis, allergic reaction, temporary paralysis, which should go away with time, bruising, blurring disturbances and delayed healing. Botulinum toxin injections do not arrest the aging process or produce permanent tightening of the eyelid.  Operative intervention maybe necessary to maintain the results of a blepharoplasty or botulinum toxin. The patient understands and wishes to proceed. ? ?Procedure: The area was prepped with alcohol and dried with a clean gauze. Using a clean technique, the botulinum toxin was diluted with 1.25 cc of preservative-free normal saline which was slowly injected with an 18 gauge needle in a tuberculin syringes.  A 32 gauge needles were then used to inject the botulinum toxin. This mixture allow for an aliquot of 4 units per 0.1 cc in each injection site.   ? ?Subsequently the mixture was injected in the glabellar and forehead area with preservation of the temporal branch to the lateral eyebrow as well as into each lateral canthal area beginning from the lateral orbital rim medial to the zygomaticus major in 3 separate areas. A total  of 40 Units of botulinum toxin was used. The forehead and glabellar area was injected with care to inject intramuscular only while holding pressure on the supratrochlear vessels in each area during each injection on either side of the medial corrugators. The injection proceeded vertically superiorly to the medial 2/3 of the frontalis muscle and superior 2/3 of the lateral frontalis, again with preservation of the frontal branch.  No complications were noted. Light pressure was held for 5 minutes. She was instructed explicitly in post-operative care. ? ?Botox ?LOT:  [id number] ?EXP:  2025/4 ? ?

## 2021-08-14 ENCOUNTER — Ambulatory Visit: Payer: Self-pay | Admitting: Plastic Surgery

## 2021-08-14 ENCOUNTER — Encounter: Payer: Self-pay | Admitting: Plastic Surgery

## 2021-08-14 DIAGNOSIS — Z719 Counseling, unspecified: Secondary | ICD-10-CM

## 2021-08-14 NOTE — Progress Notes (Signed)
Botox given in the forehead omn the left and periorbital area on the right for improved symmetry. ? ?8 Units total ?Lot: [id number] ?Exp:25-04 ?

## 2021-08-24 ENCOUNTER — Encounter: Payer: Self-pay | Admitting: Plastic Surgery

## 2021-09-07 ENCOUNTER — Other Ambulatory Visit: Payer: Self-pay | Admitting: Plastic Surgery

## 2021-11-06 ENCOUNTER — Other Ambulatory Visit: Payer: Self-pay | Admitting: Allergy and Immunology

## 2021-11-28 ENCOUNTER — Encounter (INDEPENDENT_AMBULATORY_CARE_PROVIDER_SITE_OTHER): Payer: Self-pay

## 2022-03-06 ENCOUNTER — Other Ambulatory Visit (HOSPITAL_COMMUNITY): Payer: Self-pay

## 2022-03-06 ENCOUNTER — Telehealth: Payer: Self-pay | Admitting: Plastic Surgery

## 2022-03-06 MED ORDER — LIDOCAINE 23% - TETRACAINE 7% TOPICAL OINTMENT (PLASTICIZED)
1.0000 | TOPICAL_OINTMENT | Freq: Once | CUTANEOUS | 0 refills | Status: AC
  Filled 2022-03-06: qty 60, 1d supply, fill #0

## 2022-03-06 NOTE — Telephone Encounter (Signed)
Would like a call back  today about preparing for Halo, numbing cream etc for Friday 11/17 laser procedure.

## 2022-03-06 NOTE — Telephone Encounter (Signed)
Patient called and ask if Dr Foster Simpson could give her a call about her apt on 03-08-2022

## 2022-03-06 NOTE — Telephone Encounter (Signed)
Sent Rx, discussed pt questions.

## 2022-03-07 ENCOUNTER — Other Ambulatory Visit (HOSPITAL_COMMUNITY): Payer: Self-pay

## 2022-03-08 ENCOUNTER — Other Ambulatory Visit (HOSPITAL_COMMUNITY): Payer: Self-pay

## 2022-03-08 ENCOUNTER — Ambulatory Visit: Payer: Self-pay | Admitting: Plastic Surgery

## 2022-03-08 DIAGNOSIS — Z719 Counseling, unspecified: Secondary | ICD-10-CM

## 2022-03-11 NOTE — Progress Notes (Signed)
Full Summary in media.  HALO Treatment    Topical and/or Block: lidocaine with tetracaine and pronox  Post Care: instructions given

## 2022-06-17 ENCOUNTER — Ambulatory Visit (INDEPENDENT_AMBULATORY_CARE_PROVIDER_SITE_OTHER): Payer: 59 | Admitting: Allergy and Immunology

## 2022-06-17 ENCOUNTER — Encounter: Payer: Self-pay | Admitting: Allergy and Immunology

## 2022-06-17 VITALS — BP 100/60 | HR 88 | Resp 16 | Ht 62.0 in | Wt 114.6 lb

## 2022-06-17 DIAGNOSIS — J3089 Other allergic rhinitis: Secondary | ICD-10-CM | POA: Diagnosis not present

## 2022-06-17 DIAGNOSIS — J4540 Moderate persistent asthma, uncomplicated: Secondary | ICD-10-CM | POA: Diagnosis not present

## 2022-06-17 DIAGNOSIS — Z91038 Other insect allergy status: Secondary | ICD-10-CM

## 2022-06-17 DIAGNOSIS — L281 Prurigo nodularis: Secondary | ICD-10-CM

## 2022-06-17 MED ORDER — ALBUTEROL SULFATE HFA 108 (90 BASE) MCG/ACT IN AERS: INHALATION_SPRAY | RESPIRATORY_TRACT | 1 refills | Status: AC

## 2022-06-17 MED ORDER — FLUTICASONE FUROATE-VILANTEROL 200-25 MCG/ACT IN AEPB: 1.0000 | INHALATION_SPRAY | Freq: Every day | RESPIRATORY_TRACT | 11 refills | Status: DC

## 2022-06-17 MED ORDER — IPRATROPIUM-ALBUTEROL 0.5-2.5 (3) MG/3ML IN SOLN: RESPIRATORY_TRACT | 1 refills | Status: AC

## 2022-06-17 MED ORDER — AUVI-Q 0.3 MG/0.3ML IJ SOAJ: INTRAMUSCULAR | 3 refills | Status: DC

## 2022-06-17 NOTE — Patient Instructions (Addendum)
  1.  Submit for dupilumab insurance approval for diagnosis of prurigo nodularis  2.  Continue Breo 200 - 1 inhalation 1 time per day   3.  Continue the following if needed:  A. Albuterol MDI or nebulization B. OTC antihistamine C. Auvi-Q 0.3 mg   4. Return to clinic in 1 year or earlier if problem

## 2022-06-17 NOTE — Progress Notes (Unsigned)
[place] - [place] - [place]   Follow-up Note  Referring Provider: Carol Ada, MD Primary Provider: Carol Ada, MD Date of Office Visit: 06/17/2022  Subjective:   Kelsey Decker (DOB: July 14, 1960) is a 62 y.o. female who returns to the [facility] on 06/17/2022 in re-evaluation of the following:  HPI: Tavaria returns to this clinic in evaluation of asthma, allergic rhinitis, history of hymenoptera venom hypersensitivity.  I last saw her in this clinic 24 April 2021.  She was using omalizumab up till 2 months ago which resulted in dramatic improvement regarding all of her airway issues and she was able to stop her Memory Dance and had a rare requirement for short acting bronchodilator and good exercise without any problem.  However, over the course of the past several months she developed a very pruritic inflammatory dermatosis, currently called Grovers disease" which has not responded to topical triamcinolone and has had minimal response to oral hydroxyzine.  When she received prednisone it did appear to help his issues transiently.  Her dermatologist suggest that she use dupilumab.  She stopped her ibalizumab thinking that this may have been secondary to the use of this agent.  Once again she remains away from hymenoptera venom exposure.  Allergies as of 06/17/2022       Reactions   Other Shortness Of Breath, Cough   BEE STING   Codeine         Medication List    Auvi-Q 0.3 mg/0.3 mL Soaj injection Generic drug: EPINEPHrine SMARTSIG:1 IM Daily   betamethasone valerate 0.1 % cream Commonly known as: VALISONE APPLY THIN LAYER TOPICALLY TO THE AFFECTED AREA EVERY DAY   buPROPion 150 MG 24 hr tablet Commonly known as: WELLBUTRIN XL Take 1 tablet by mouth daily.   clobetasol 0.05 % external solution Commonly known as: TEMOVATE APPLY TO THE AFFECTED AREA EVERY NIGHT MONDAY THROUGH FRIDAY   estradiol 0.5 MG  tablet Commonly known as: ESTRACE Take 1 tablet every day by oral route.   Estring 7.5 MCG/24HR vaginal ring Generic drug: estradiol See admin instructions.   fluticasone furoate-vilanterol 200-25 MCG/ACT Aepb Commonly known as: Breo Ellipta Inhale 1 puff into the lungs daily.   hydrOXYzine 50 MG tablet Commonly known as: ATARAX Take 1 tablet by mouth 2 (two) times daily.   ipratropium-albuterol 0.5-2.5 (3) MG/3ML Soln Commonly known as: DUONEB Take 3 mLs by nebulization every 4 (four) hours as needed.   levothyroxine 25 MCG tablet Commonly known as: SYNTHROID 25 mcg daily.   Mounjaro 5 MG/0.5ML Pen Generic drug: tirzepatide Inject 5 mg every week by subcutaneous route for 28 days.   progesterone 100 MG capsule Commonly known as: PROMETRIUM Take 1 capsule by mouth at bedtime.   triamcinolone 55 MCG/ACT Aero nasal inhaler Commonly known as: NASACORT Place 2 sprays into the nose daily.   triamcinolone cream 0.1 % Commonly known as: KENALOG Apply topically 2 (two) times daily.   UNABLE TO FIND Intestinal support 1 tab prn   Ventolin HFA 108 (90 Base) MCG/ACT inhaler Generic drug: albuterol Inhale 2 puffs into the lungs every 6 (six) hours as needed for wheezing or shortness of breath.   albuterol (2.5 MG/3ML) 0.083% nebulizer solution Commonly known as: PROVENTIL Take 3 mLs (2.5 mg total) by nebulization every 6 (six) hours as needed.   Xolair 150 MG/ML prefilled syringe Generic drug: omalizumab INJECT '150MG'$  SUBCUTANEOUSLY EVERY 4 WEEKS    Past Medical History:  Diagnosis Date  Asthma    Cataract    Erosive esophagitis    GERD (gastroesophageal reflux disease)    Grover's disease    Hypothyroidism    Iron deficiency     Past Surgical History:  Procedure Laterality Date   ADENOIDECTOMY     APPENDECTOMY     CESAREAN SECTION     PATENT FORAMEN OVALE CLOSURE     TONSILLECTOMY AND ADENOIDECTOMY      Review of systems negative except as noted in HPI  / PMHx or noted below:  Review of Systems  Constitutional: Negative.   HENT: Negative.    Eyes: Negative.   Respiratory: Negative.    Cardiovascular: Negative.   Gastrointestinal: Negative.   Genitourinary: Negative.   Musculoskeletal: Negative.   Skin: Negative.   Neurological: Negative.   Endo/Heme/Allergies: Negative.   Psychiatric/Behavioral: Negative.       Objective:   Vitals:   06/17/22 1115  BP: 100/60  Pulse: 88  Resp: 16  SpO2: 96%   Height: '5\' 2"'$  (157.5 cm)  Weight: 114 lb 9.6 oz (52 kg)   Physical Exam Constitutional:      Appearance: She is not diaphoretic.  HENT:     Head: Normocephalic.     Right Ear: Tympanic membrane, ear canal and external ear normal.     Left Ear: Tympanic membrane, ear canal and external ear normal.     Nose: Nose normal. No mucosal edema or rhinorrhea.     Mouth/Throat:     Pharynx: Uvula midline. No oropharyngeal exudate.  Eyes:     Conjunctiva/sclera: Conjunctivae normal.  Neck:     Thyroid: No thyromegaly.     Trachea: Trachea normal. No tracheal tenderness or tracheal deviation.  Cardiovascular:     Rate and Rhythm: Normal rate and regular rhythm.     Heart sounds: Normal heart sounds, S1 normal and S2 normal. No murmur heard. Pulmonary:     Effort: No respiratory distress.     Breath sounds: Normal breath sounds. No stridor. No wheezing or rales.  Lymphadenopathy:     Head:     Right side of head: No tonsillar adenopathy.     Left side of head: No tonsillar adenopathy.     Cervical: No cervical adenopathy.  Skin:    Findings: No erythema or rash (Multiple excoriated papules across trunk and extremities with some nodular formation).     Nails: There is no clubbing.  Neurological:     Mental Status: She is alert.     Diagnostics:    Spirometry was performed and demonstrated an FEV1 of 1.90 at 85 % of predicted.  Assessment and Plan:   1. Asthma, moderate persistent, well-controlled   2. Perennial allergic  rhinitis   3. Hymenoptera allergy   4. Prurigo nodularis    1.  Submit for [employer] approval for diagnosis of prurigo nodularis  2.  Continue Breo 200 - 1 inhalation 1 time per day   3.  Continue the following if needed:  A. Albuterol MDI or nebulization B. OTC antihistamine C. Auvi-Q 0.3 mg   4. Return to clinic in 1 year or earlier if problem  Kashawna will probably need some type of biologic agent to treat her asthma as her omalizumab wears off.  She was doing great with omalizumab and she was able to eliminate the use of her controller agent and rare use of short acting bronchodilator.  Given that she now has in a inflammatory dermatosis with features that look like  prurigo nodularis I think we can probably give her dupilumab to treat both her respiratory tract issue and her skin issue.  We will see if we can get approval for that agent through insurance over the course the next few weeks.  Allena Katz, MD Allergy / Immunology [place]

## 2022-06-18 ENCOUNTER — Encounter: Payer: Self-pay | Admitting: Allergy and Immunology

## 2022-06-19 ENCOUNTER — Telehealth: Payer: Self-pay | Admitting: *Deleted

## 2022-06-19 NOTE — Telephone Encounter (Signed)
-----   Message from Jiles Prows, MD sent at 06/18/2022  6:06 AM EST ----- Dupi for PN

## 2022-06-19 NOTE — Telephone Encounter (Signed)
L/m for patient to contact me to advise approval and submit for Dupixent for prurigo nodularis to [facility]

## 2022-06-25 NOTE — Telephone Encounter (Signed)
L/m for patient to contact me  

## 2022-07-03 NOTE — Telephone Encounter (Signed)
Called patient back and she advised she has ne Ins as of 3/1 and will email info to me so I can obtain another approval

## 2022-07-16 ENCOUNTER — Ambulatory Visit: Payer: 59 | Admitting: Allergy and Immunology

## 2022-07-16 NOTE — Telephone Encounter (Signed)
Spoke to patient and finally got new Ins will submit and reach out to patient

## 2022-08-26 NOTE — Telephone Encounter (Signed)
Submitted biopsy results and In has denied Dupixent for Prurigo nodularis due to no pathology stating diagnosis. Please advise

## 2022-09-02 NOTE — Telephone Encounter (Signed)
L/m for patient to contact me to advise denial by Ins for dupixent

## 2023-01-08 ENCOUNTER — Ambulatory Visit (INDEPENDENT_AMBULATORY_CARE_PROVIDER_SITE_OTHER): Payer: Managed Care, Other (non HMO) | Admitting: Orthopaedic Surgery

## 2023-01-08 ENCOUNTER — Other Ambulatory Visit (INDEPENDENT_AMBULATORY_CARE_PROVIDER_SITE_OTHER): Payer: Managed Care, Other (non HMO)

## 2023-01-08 ENCOUNTER — Encounter: Payer: Self-pay | Admitting: Orthopaedic Surgery

## 2023-01-08 DIAGNOSIS — M25552 Pain in left hip: Secondary | ICD-10-CM

## 2023-01-08 NOTE — Progress Notes (Signed)
The patient is a 62 year old female that I am seeing for the first time but I have seen her husband.  She is very active and thin.  She has never had any issues with her left hip but about a month and a half ago she was visiting her father at a skilled nursing facility and she had to catch a man that was following.  She twisted her hip at that time and is still having a constant ache and pain in her groin with certain movements that really bother her in her left hip.  She again had no previous hip issues before this injury.  She denies any issues with her right hip.  She does report stiffness and pain in the groin the left side and just a little bit of lateral pain.  She is otherwise an active individual.  I was able to review all of her notes in terms of past medical history and medications within epic.  On exam her right hip moves smoothly and fluidly.  The left hip actually has some stiffness and pain in the groin with internal and external rotation.  When I have her lay on her side there is really no significant pain around the trochanteric area or the IT band of the left hip.  She did let me know that is getting a little bit more difficult putting her shoes and socks on on that left side.  She even does walk with a limp favoring the left hip.  An AP pelvis and lateral left hip shows the joint spaces are well-maintained the left and right hip but there is slight narrowing on the left side comparing left and right side it is very slight.  I do believe that she does have an acute injury to her left hip and it could be manifesting as a labral tear.  I would actually like to send her next week to my partner Dr. Shon Baton when he is back in town so he can assess her left hip under ultrasound and also provide hopefully a diagnostic and therapeutic steroid injection in that left hip joint.  She agrees with this treatment plan as well.  She will still go slow with that hip until her appointment with Dr. Shon Baton.  I  would then like him to get her back to me about 2 weeks later.  All questions and concerns were addressed and answered.

## 2023-01-20 ENCOUNTER — Ambulatory Visit (INDEPENDENT_AMBULATORY_CARE_PROVIDER_SITE_OTHER): Payer: Managed Care, Other (non HMO) | Admitting: Sports Medicine

## 2023-01-20 ENCOUNTER — Other Ambulatory Visit: Payer: Self-pay

## 2023-01-20 ENCOUNTER — Encounter: Payer: Self-pay | Admitting: Sports Medicine

## 2023-01-20 DIAGNOSIS — S73192A Other sprain of left hip, initial encounter: Secondary | ICD-10-CM

## 2023-01-20 DIAGNOSIS — M25552 Pain in left hip: Secondary | ICD-10-CM

## 2023-01-20 MED ORDER — METHYLPREDNISOLONE ACETATE 40 MG/ML IJ SUSP
80.0000 mg | INTRAMUSCULAR | Status: AC | PRN
  Administered 2023-01-20: 80 mg via INTRA_ARTICULAR

## 2023-01-20 MED ORDER — LIDOCAINE HCL 1 % IJ SOLN
4.0000 mL | INTRAMUSCULAR | Status: AC | PRN
  Administered 2023-01-20: 4 mL
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
## 2023-02-05 ENCOUNTER — Encounter: Payer: Self-pay | Admitting: Orthopaedic Surgery

## 2023-02-05 ENCOUNTER — Ambulatory Visit (INDEPENDENT_AMBULATORY_CARE_PROVIDER_SITE_OTHER): Payer: Managed Care, Other (non HMO) | Admitting: Orthopaedic Surgery

## 2023-02-05 DIAGNOSIS — S73192A Other sprain of left hip, initial encounter: Secondary | ICD-10-CM | POA: Diagnosis not present

## 2023-02-05 DIAGNOSIS — M25552 Pain in left hip: Secondary | ICD-10-CM | POA: Diagnosis not present

## 2023-02-05 NOTE — Progress Notes (Signed)
The patient comes back for follow-up after having an ultrasound-guided injection in her left hip by Dr. Shon Baton.  She says that injection was great for about a week and a half.  She is an active 62 year old female and up until 2 months ago had not had any hip pain until an acute injury.  She is back to having the same hip pain and stiffness again.  Her plain film showed just minimal narrowing of the joint space.  On exam she is walking without assistive device but there is a slight limp.  She still has pain in the groin with internal and external rotation of the left hip.  This point I will have her work on hip abduction exercises but we definitely need to send her for a MRI arthrogram of the left hip to assess the cartilage and to rule out a labral tear considering her ultrasound findings combined with the help that she is gone from that injection with the fact that her hip is still hurting her.  We will see her back once we have this MRI arthrogram of her left hip.

## 2023-02-06 ENCOUNTER — Other Ambulatory Visit: Payer: Self-pay

## 2023-02-06 DIAGNOSIS — M25552 Pain in left hip: Secondary | ICD-10-CM

## 2023-02-19 ENCOUNTER — Ambulatory Visit
Admission: RE | Admit: 2023-02-19 | Discharge: 2023-02-19 | Disposition: A | Discharge status: Home / Self Care | Payer: Managed Care, Other (non HMO) | Source: Ambulatory Visit | Attending: Orthopaedic Surgery | Admitting: Orthopaedic Surgery

## 2023-02-19 ENCOUNTER — Telehealth: Payer: Self-pay | Admitting: Orthopaedic Surgery

## 2023-02-19 ENCOUNTER — Other Ambulatory Visit: Payer: Self-pay | Admitting: Orthopaedic Surgery

## 2023-02-19 DIAGNOSIS — M25552 Pain in left hip: Secondary | ICD-10-CM

## 2023-02-19 MED ORDER — IOPAMIDOL (ISOVUE-M 200) INJECTION 41%
8.0000 mL | Freq: Once | INTRAMUSCULAR | Status: AC
  Administered 2023-02-19: 8 mL via INTRA_ARTICULAR

## 2023-02-19 MED ORDER — CELECOXIB 200 MG PO CAPS: 200.0000 mg | ORAL_CAPSULE | Freq: Two times a day (BID) | ORAL | 1 refills | Status: DC | PRN

## 2023-02-19 NOTE — Telephone Encounter (Signed)
Patient called requesting pain medication. Kelsey Decker [phone]

## 2023-02-20 NOTE — Telephone Encounter (Signed)
Patient aware this was sent in for her  

## 2023-03-10 ENCOUNTER — Encounter: Payer: Self-pay | Admitting: Orthopaedic Surgery

## 2023-03-10 ENCOUNTER — Ambulatory Visit (INDEPENDENT_AMBULATORY_CARE_PROVIDER_SITE_OTHER): Payer: Managed Care, Other (non HMO) | Admitting: Orthopaedic Surgery

## 2023-03-10 DIAGNOSIS — M1612 Unilateral primary osteoarthritis, left hip: Secondary | ICD-10-CM | POA: Insufficient documentation

## 2023-03-10 DIAGNOSIS — M25552 Pain in left hip: Secondary | ICD-10-CM | POA: Diagnosis not present
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
## 2023-03-17 ENCOUNTER — Encounter: Payer: Self-pay | Admitting: Plastic Surgery

## 2023-03-30 IMAGING — CR DG CHEST 2V
2 series · 2 of 2 positions shown · non-contrast
Comparison: Chest x-ray 11/22/2008.

CLINICAL DATA: 60-year-old female with history of prior COVID
infection in August 2020 presenting with persistent fatigue and cough.

EXAM:
CHEST - 2 VIEW

[w chest pa]
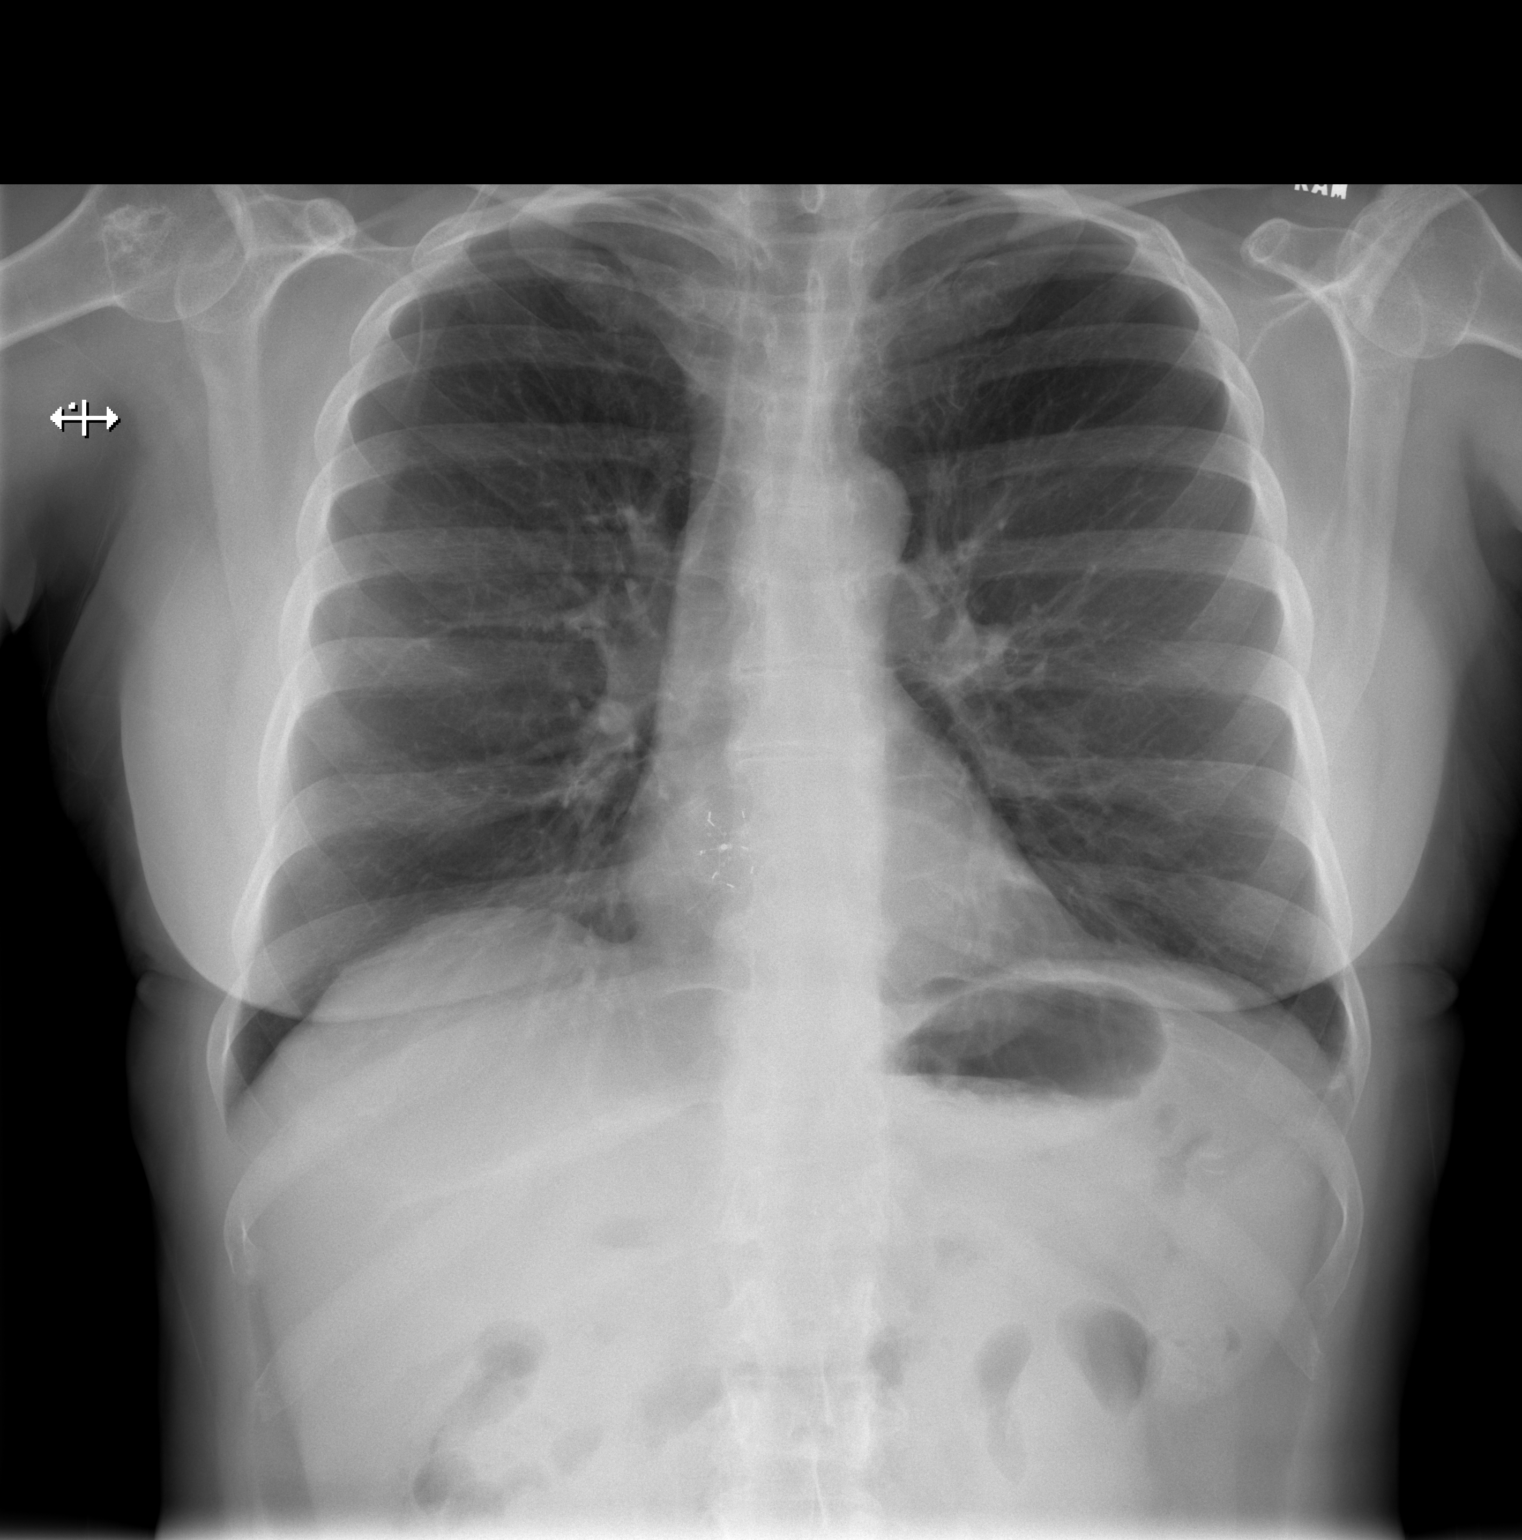

[w chest lat]
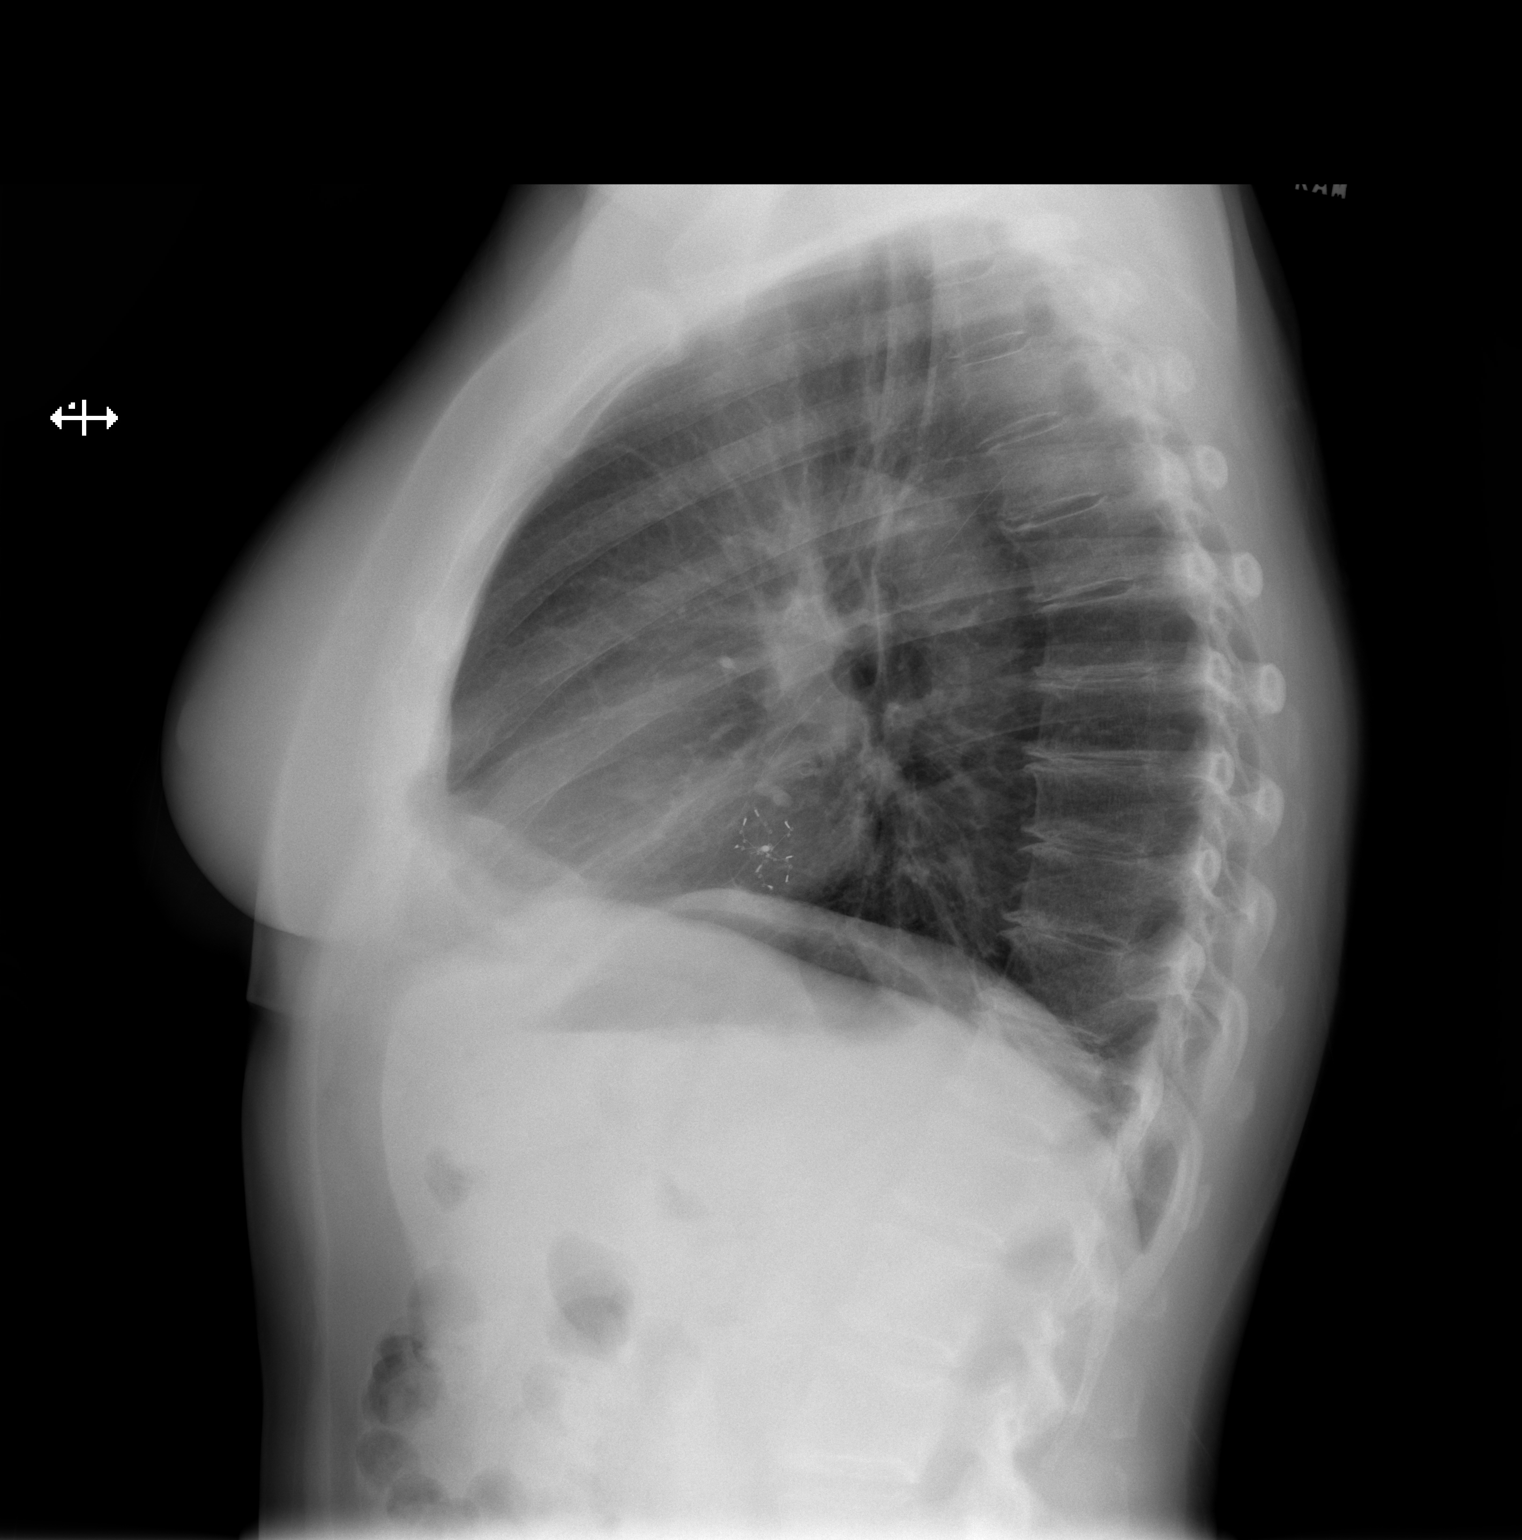

[2 of 2 positions shown; findings below may reference images not displayed]

FINDINGS: Lung volumes are normal. No consolidative airspace disease. No
pleural effusions. No pneumothorax. No pulmonary nodule or mass
noted. Pulmonary vasculature and the cardiomediastinal silhouette
are within normal limits. Atrial septal occluder device noted.
Sclerotic lesion in the right proximal humerus, similar to prior
examinations, likely to represent an enchondroma.
IMPRESSION: No radiographic evidence of acute cardiopulmonary disease.

## 2023-04-04 ENCOUNTER — Other Ambulatory Visit: Payer: Self-pay | Admitting: Orthopaedic Surgery

## 2023-04-08 ENCOUNTER — Other Ambulatory Visit: Payer: Self-pay | Admitting: Plastic Surgery

## 2023-04-08 ENCOUNTER — Other Ambulatory Visit (HOSPITAL_COMMUNITY): Payer: Self-pay

## 2023-04-08 MED ORDER — LIDOCAINE 23% - TETRACAINE 7% TOPICAL OINTMENT (PLASTICIZED)
1.0000 | TOPICAL_OINTMENT | Freq: Once | CUTANEOUS | 0 refills | Status: AC
  Filled 2023-04-08: qty 60, 1d supply, fill #0

## 2023-04-08 MED ORDER — VALACYCLOVIR HCL 500 MG PO TABS
500.0000 mg | ORAL_TABLET | Freq: Two times a day (BID) | ORAL | 0 refills | Status: AC
  Filled 2023-04-08: qty 6, 3d supply, fill #0

## 2023-04-08 NOTE — Progress Notes (Signed)
Numbing cream sent to pharmacy.

## 2023-04-10 ENCOUNTER — Encounter: Payer: Self-pay | Admitting: Plastic Surgery

## 2023-04-10 ENCOUNTER — Ambulatory Visit (INDEPENDENT_AMBULATORY_CARE_PROVIDER_SITE_OTHER): Payer: Self-pay | Admitting: Plastic Surgery

## 2023-04-10 DIAGNOSIS — Z719 Counseling, unspecified: Secondary | ICD-10-CM

## 2023-04-10 NOTE — Progress Notes (Signed)
[facility]  Face, neck, chest. Settings in the media.  Pronox used and cream

## 2023-05-26 ENCOUNTER — Encounter: Payer: Self-pay | Admitting: Plastic Surgery

## 2023-05-30 ENCOUNTER — Institutional Professional Consult (permissible substitution): Payer: Managed Care, Other (non HMO) | Admitting: Plastic Surgery

## 2023-06-06 ENCOUNTER — Other Ambulatory Visit: Payer: Self-pay | Admitting: Obstetrics and Gynecology

## 2023-06-06 DIAGNOSIS — R928 Other abnormal and inconclusive findings on diagnostic imaging of breast: Secondary | ICD-10-CM

## 2023-06-11 ENCOUNTER — Ambulatory Visit: Payer: Managed Care, Other (non HMO) | Admitting: Orthopaedic Surgery

## 2023-06-17 ENCOUNTER — Ambulatory Visit: Payer: Self-pay | Admitting: Allergy and Immunology

## 2023-06-18 ENCOUNTER — Ambulatory Visit: Payer: Managed Care, Other (non HMO)

## 2023-06-18 ENCOUNTER — Ambulatory Visit
Admission: RE | Admit: 2023-06-18 | Discharge: 2023-06-18 | Disposition: A | Discharge status: Home / Self Care | Payer: Managed Care, Other (non HMO) | Source: Ambulatory Visit | Attending: Obstetrics and Gynecology | Admitting: Obstetrics and Gynecology

## 2023-06-18 DIAGNOSIS — R928 Other abnormal and inconclusive findings on diagnostic imaging of breast: Secondary | ICD-10-CM

## 2023-07-08 ENCOUNTER — Other Ambulatory Visit: Payer: Self-pay

## 2023-07-08 ENCOUNTER — Encounter: Payer: Self-pay | Admitting: Allergy and Immunology

## 2023-07-08 ENCOUNTER — Ambulatory Visit (INDEPENDENT_AMBULATORY_CARE_PROVIDER_SITE_OTHER): Payer: Managed Care, Other (non HMO) | Admitting: Allergy and Immunology

## 2023-07-08 VITALS — BP 118/70 | HR 70 | Temp 98.1°F | Resp 16 | Ht 62.0 in | Wt 129.8 lb

## 2023-07-08 DIAGNOSIS — Z91038 Other insect allergy status: Secondary | ICD-10-CM

## 2023-07-08 DIAGNOSIS — J4540 Moderate persistent asthma, uncomplicated: Secondary | ICD-10-CM

## 2023-07-08 DIAGNOSIS — J3089 Other allergic rhinitis: Secondary | ICD-10-CM | POA: Diagnosis not present

## 2023-07-08 MED ORDER — AIRSUPRA 90-80 MCG/ACT IN AERO: 2.0000 | INHALATION_SPRAY | RESPIRATORY_TRACT | 3 refills | Status: AC | PRN

## 2023-07-08 MED ORDER — LEVOCETIRIZINE DIHYDROCHLORIDE 5 MG PO TABS: 5.0000 mg | ORAL_TABLET | Freq: Every day | ORAL | 3 refills | Status: AC | PRN

## 2023-07-08 MED ORDER — AUVI-Q 0.3 MG/0.3ML IJ SOAJ: 0.3000 mg | INTRAMUSCULAR | 1 refills | Status: AC | PRN

## 2023-07-08 MED ORDER — FLUTICASONE FUROATE-VILANTEROL 200-25 MCG/ACT IN AEPB: 1.0000 | INHALATION_SPRAY | Freq: Every day | RESPIRATORY_TRACT | 3 refills | Status: AC

## 2023-07-08 NOTE — Patient Instructions (Signed)
  1.  Submit for omalizumab injections for severe asthma  2.  Continue Breo 200 - 1 inhalation 1 time per day   3.  Continue the following if needed:  A. AIRSUPRA or nebulization B. OTC antihistamine C. Auvi-Q 0.3 mg   4. Return to clinic in 1 year or earlier if problem  5. Influenza = Tamiflu. Covid = Paxlovid

## 2023-07-08 NOTE — Progress Notes (Unsigned)
 [facility] [place] [facility] [place] [facility] [place] [facility] [place]   Follow-up Note  Referring Provider: Merri Brunette, MD Primary Provider: Merri Brunette, MD Date of Office Visit: 07/08/2023  Subjective:   Kelsey Decker (DOB: 11/25/1960) is a 63 y.o. female who [facility] in re-evaluation of the following:  HPI: Azhar returns to this clinic in evaluation of asthma and allergic rhinitis and history of hymenoptera venom hypersensitivity.  I last saw her in this clinic 17 June 2022.  Because of an insurance issue she had to discontinue her omalizumab and since doing so she has had much more problem with wheezing and coughing and her bronchodilator requirement has increased to several times per week and her exercise capacity has dropped off.  She would like to go back on omalizumab.  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  Because there is spontaneous resolution of this issue she did not pursue the use of dupilumab for prurigo nodularis.  She continues to have an EpiPen for her hymenoptera venom hypersensitivity state.  Allergies as of 07/08/2023       Reactions   Other Shortness Of Breath, Cough   BEE STING   Codeine         Medication List    Airsupra 90-80 MCG/ACT Aero Generic drug: Albuterol-Budesonide Inhale 2 Inhalations into the lungs every 4 (four) hours as needed. Started by: Bradin Mcadory J Haniyyah Sakuma   albuterol (2.5 MG/3ML) 0.083% nebulizer solution Commonly known as: PROVENTIL Take 3 mLs (2.5 mg total) by nebulization every 6 (six) hours as needed.   albuterol 108 (90 Base) MCG/ACT inhaler Commonly known as: Ventolin HFA Can inhale two puffs every four to six hours as needed for cough or wheeze.   Auvi-Q 0.3 MG/0.3ML Soaj injection Generic drug: EPINEPHrine Inject 0.3 mg  into the muscle as needed for anaphylaxis. Use as directed for life-threatening allergic reaction.   betamethasone valerate 0.1 % cream Commonly known as: VALISONE APPLY THIN LAYER TOPICALLY TO THE AFFECTED AREA EVERY DAY   buPROPion 150 MG 24 hr tablet Commonly known as: WELLBUTRIN XL Take 1 tablet by mouth daily.   celecoxib 200 MG capsule Commonly known as: CELEBREX TAKE 1 CAPSULE (200 MG TOTAL) BY MOUTH 2 (TWO) TIMES DAILY BETWEEN MEALS AS NEEDED.   clobetasol 0.05 % external solution Commonly known as: TEMOVATE APPLY TO THE AFFECTED AREA EVERY NIGHT MONDAY THROUGH FRIDAY   estradiol 0.5 MG tablet Commonly known as: ESTRACE Take 1 tablet every day by oral route.   Estring 2 MG vaginal ring Generic drug: estradiol See admin instructions.   fluticasone furoate-vilanterol 200-25 MCG/ACT Aepb Commonly known as: Breo Ellipta Inhale 1 puff into the lungs daily. Rinse, gargle, and spit after use.   hydrOXYzine 50 MG tablet Commonly known as: ATARAX Take 1 tablet by mouth 2 (two) times daily.   ipratropium-albuterol 0.5-2.5 (3) MG/3ML Soln Commonly known as: DUONEB Can use one vial in nebulizer every four to six hours as needed for cough or wheeze.   levocetirizine 5 MG tablet Commonly known as: XYZAL Take 1 tablet (5 mg total) by mouth daily as needed for allergies (Can take an extra dose during flare ups.). Started by: Adien Kimmel J Kaede Clendenen   levothyroxine 25 MCG tablet Commonly known as: SYNTHROID 25 mcg daily.   Mounjaro 5 MG/0.5ML Pen Generic  drug: tirzepatide Inject 5 mg every week by subcutaneous route for 28 days.   PRENATAL VITAMIN PO Take by mouth daily.   progesterone 100 MG capsule Commonly known as: PROMETRIUM Take 1 capsule by mouth at bedtime.   triamcinolone 55 MCG/ACT Aero nasal inhaler Commonly known as: NASACORT Place 2 sprays into the nose daily.   triamcinolone cream 0.1 % Commonly known as: KENALOG Apply topically 2 (two) times daily.    UNABLE TO FIND Intestinal support 1 tab prn   Xolair 150 MG/ML prefilled syringe Generic drug: omalizumab INJECT 150MG  SUBCUTANEOUSLY EVERY 4 WEEKS    Past Medical History:  Diagnosis Date   Asthma    Cataract    Erosive esophagitis    GERD (gastroesophageal reflux disease)    Grover's disease    Hypothyroidism    Iron deficiency     Past Surgical History:  Procedure Laterality Date   ADENOIDECTOMY     APPENDECTOMY     CESAREAN SECTION     PATENT FORAMEN OVALE CLOSURE     TONSILLECTOMY AND ADENOIDECTOMY      Review of systems negative except as noted in HPI / PMHx or noted below:  Review of Systems  Constitutional: Negative.   HENT: Negative.    Eyes: Negative.   Respiratory: Negative.    Cardiovascular: Negative.   Gastrointestinal: Negative.   Genitourinary: Negative.   Musculoskeletal: Negative.   Skin: Negative.   Neurological: Negative.   Endo/Heme/Allergies: Negative.   Psychiatric/Behavioral: Negative.       Objective:   Vitals:   07/08/23 0921  BP: 118/70  Pulse: 70  Resp: 16  Temp: 98.1 F (36.7 C)  SpO2: 95%   Height: 5\' 2"  (157.5 cm)  Weight: 129 lb 12.8 oz (58.9 kg)   Physical Exam Constitutional:      Appearance: She is not diaphoretic.  HENT:     Head: Normocephalic.     Right Ear: Tympanic membrane, ear canal and external ear normal.     Left Ear: Tympanic membrane, ear canal and external ear normal.     Nose: Nose normal. No mucosal edema or rhinorrhea.     Mouth/Throat:     Pharynx: Uvula midline. No oropharyngeal exudate.  Eyes:     Conjunctiva/sclera: Conjunctivae normal.  Neck:     Thyroid: No thyromegaly.     Trachea: Trachea normal. No tracheal tenderness or tracheal deviation.  Cardiovascular:     Rate and Rhythm: Normal rate and regular rhythm.     Heart sounds: Normal heart sounds, S1 normal and S2 normal. No murmur heard. Pulmonary:     Effort: No respiratory distress.     Breath sounds: Normal breath sounds.  No stridor. No wheezing or rales.  Lymphadenopathy:     Head:     Right side of head: No tonsillar adenopathy.     Left side of head: No tonsillar adenopathy.     Cervical: No cervical adenopathy.  Skin:    Findings: No erythema or rash.     Nails: There is no clubbing.  Neurological:     Mental Status: She is alert.     Diagnostics:    Spirometry was performed and demonstrated an FEV1 of 1.40 at 64. % of predicted.  Her previous FEV1 was 1.90.  The patient had an Asthma Control Test with the following results: ACT Total Score: 22.    Assessment and Plan:   1. Not well controlled moderate persistent asthma   2. Perennial allergic rhinitis  3. Hymenoptera allergy    1.  Submit for omalizumab injections for severe asthma  2.  Continue Breo 200 - 1 inhalation 1 time per day   3.  Continue the following if needed:  A. AIRSUPRA or nebulization B. OTC antihistamine C. Auvi-Q 0.3 mg   4. Return to clinic in 1 year or earlier if problem  5. Influenza = Tamiflu. Covid = Paxlovid  Jenelle will restart omalizumab as this biologic agent gives her great relief regarding control of her asthma.  Her spirometry is pretty significantly diminished without the use of this medication and her bronchodilator requirement is significantly increased without the use of this medication.  We will give her an anti-inflammatory rescue medicine to replace her albuterol to be used as needed.  She will continue to carry an epinephrine device for her hymenoptera venom hypersensitivity.  I will see her back in this clinic in 1 year or earlier if there is a problem.  Laurette Schimke, MD Allergy / Immunology [facility]

## 2023-07-09 ENCOUNTER — Encounter: Payer: Self-pay | Admitting: Allergy and Immunology

## 2023-07-15 ENCOUNTER — Other Ambulatory Visit (HOSPITAL_COMMUNITY): Payer: Self-pay | Admitting: Family Medicine

## 2023-07-15 DIAGNOSIS — Z8249 Family history of ischemic heart disease and other diseases of the circulatory system: Secondary | ICD-10-CM

## 2023-07-23 ENCOUNTER — Ambulatory Visit (HOSPITAL_COMMUNITY)
Admission: RE | Admit: 2023-07-23 | Discharge: 2023-07-23 | Disposition: A | Discharge status: Home / Self Care | Payer: Self-pay | Source: Ambulatory Visit | Attending: Family Medicine | Admitting: Family Medicine

## 2023-07-23 DIAGNOSIS — Z8249 Family history of ischemic heart disease and other diseases of the circulatory system: Secondary | ICD-10-CM | POA: Insufficient documentation

## 2023-08-19 ENCOUNTER — Ambulatory Visit (INDEPENDENT_AMBULATORY_CARE_PROVIDER_SITE_OTHER): Payer: Self-pay | Admitting: Plastic Surgery

## 2023-08-19 DIAGNOSIS — Z719 Counseling, unspecified: Secondary | ICD-10-CM

## 2023-08-19 NOTE — Progress Notes (Signed)
 Dysport and Filler Injection Procedure Note  Procedure: Cosmetic botulinum toxin and Filler administration  Pre-operative Diagnosis: Dynamic rhytides and midface volume loss  Post-operative Diagnosis: Same  Complications:  None  Brief history: The patient desires botulinum toxin injection of her forehead. I discussed with the patient this proposed procedure of botulinum toxin injections, which is customized depending on the particular needs of the patient. It is performed on facial rhytids as a temporary correction. The alternatives were discussed with the patient. The risks were addressed including bleeding, scarring, infection, damage to deeper structures, asymmetry, and chronic pain, which may occur infrequently after a procedure. The individual's choice to undergo a surgical procedure is based on the comparison of risks to potential benefits. Other risks include unsatisfactory results, brow ptosis, eyelid ptosis, allergic reaction, temporary paralysis, which should go away with time, bruising, blurring disturbances and delayed healing. Botulinum toxin injections do not arrest the aging process or produce permanent tightening of the eyelid.  Operative intervention maybe necessary to maintain the results of a blepharoplasty or botulinum toxin. The patient understands and wishes to proceed. Consent was given.  Procedure: The area was prepped with alcohol and dried with a clean gauze. Using a clean technique, the botulinum toxin was diluted with 3 cc of preservative-free normal saline which was slowly injected with an 18 gauge needle in a tuberculin syringes.  A 32 gauge needles were then used to inject the botulinum toxin. This mixture allow for an aliquot of 10 units per 0.1 cc in each injection site.    Subsequently the mixture was injected in the glabellar and forehead area with preservation of the temporal branch to the lateral eyebrow as well as into each lateral canthal area beginning from the  lateral orbital rim medial to the zygomaticus major in 3 separate areas. A total of 100 Units of botulinum toxin was used. The forehead and glabellar area was injected with care to inject intramuscular only while holding pressure on the supratrochlear vessels in each area during each injection on either side of the medial corrugators. The injection proceeded vertically superiorly to the medial 2/3 of the frontalis muscle and superior 2/3 of the lateral frontalis, again with preservation of the frontal branch.  No complications were noted. Light pressure was held for 5 minutes. She was instructed explicitly in post-operative care.  Dysport LOT:  [id number]

## 2023-08-20 ENCOUNTER — Encounter: Admitting: Surgical

## 2023-09-11 ENCOUNTER — Ambulatory Visit (INDEPENDENT_AMBULATORY_CARE_PROVIDER_SITE_OTHER): Admitting: Physician Assistant

## 2023-09-11 ENCOUNTER — Other Ambulatory Visit: Payer: Self-pay | Admitting: Radiology

## 2023-09-11 DIAGNOSIS — M79671 Pain in right foot: Secondary | ICD-10-CM

## 2023-09-11 DIAGNOSIS — M19171 Post-traumatic osteoarthritis, right ankle and foot: Secondary | ICD-10-CM | POA: Diagnosis not present
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
## 2023-09-22 ENCOUNTER — Ambulatory Visit: Payer: Self-pay

## 2023-09-22 DIAGNOSIS — Z719 Counseling, unspecified: Secondary | ICD-10-CM

## 2023-09-23 ENCOUNTER — Telehealth: Payer: Self-pay

## 2023-09-23 NOTE — Telephone Encounter (Signed)
 Pt called and said she purchased the anti age kit from you and said it was for oily skin, pt stated she has dry skin. She stated she needs something different and said she has not opened anything and what do you recommend? Please advise

## 2023-11-07 ENCOUNTER — Encounter: Payer: Self-pay | Admitting: Advanced Practice Midwife

## 2024-02-23 ENCOUNTER — Encounter: Payer: Self-pay | Admitting: Radiology

## 2024-03-09 ENCOUNTER — Ambulatory Visit: Payer: Self-pay | Admitting: Plastic Surgery

## 2024-03-09 DIAGNOSIS — Z719 Counseling, unspecified: Secondary | ICD-10-CM

## 2024-03-09 NOTE — Progress Notes (Signed)
 Dysport Procedure Note  Procedure: Cosmetic botulinum toxin   Pre-operative Diagnosis: Dynamic rhytides and midface volume loss  Post-operative Diagnosis: Same  Complications:  None  Brief history: The patient desires botulinum toxin injection of her forehead. I discussed with the patient this proposed procedure of botulinum toxin injections, which is customized depending on the particular needs of the patient. It is performed on facial rhytids as a temporary correction. The alternatives were discussed with the patient. The risks were addressed including bleeding, scarring, infection, damage to deeper structures, asymmetry, and chronic pain, which may occur infrequently after a procedure. The individual's choice to undergo a surgical procedure is based on the comparison of risks to potential benefits. Other risks include unsatisfactory results, brow ptosis, eyelid ptosis, allergic reaction, temporary paralysis, which should go away with time, bruising, blurring disturbances and delayed healing. Botulinum toxin injections do not arrest the aging process or produce permanent tightening of the eyelid.  Operative intervention maybe necessary to maintain the results of a blepharoplasty or botulinum toxin. The patient understands and wishes to proceed. Consent was given.  Procedure: The area was prepped with alcohol and dried with a clean gauze. Using a clean technique, the botulinum toxin was diluted with 3 cc of preservative-free normal saline which was slowly injected with an 18 gauge needle in a tuberculin syringes.  A 32 gauge needles were then used to inject the botulinum toxin. This mixture allow for an aliquot of 10 units per 0.1 cc in each injection site.    Subsequently the mixture was injected in the glabellar and forehead area with preservation of the temporal branch to the lateral eyebrow as well as into each lateral canthal area beginning from the lateral orbital rim medial to the zygomaticus  major in 3 separate areas. A total of 100 Units of botulinum toxin was used. The forehead and glabellar area was injected with care to inject intramuscular only while holding pressure on the supratrochlear vessels in each area during each injection on either side of the medial corrugators. The injection proceeded vertically superiorly to the medial 2/3 of the frontalis muscle and superior 2/3 of the lateral frontalis, again with preservation of the frontal branch.  No complications were noted. Light pressure was held for 5 minutes. She was instructed explicitly in post-operative care.  Dysport LOT:  [id number]

## 2024-03-24 ENCOUNTER — Ambulatory Visit (INDEPENDENT_AMBULATORY_CARE_PROVIDER_SITE_OTHER): Payer: Self-pay

## 2024-03-24 VITALS — BP 104/63 | HR 86 | Ht 62.0 in | Wt 134.6 lb

## 2024-03-24 DIAGNOSIS — R238 Other skin changes: Secondary | ICD-10-CM

## 2024-03-24 NOTE — Progress Notes (Signed)
 NAME: Kelsey Decker  MRN: [id number]  DOB: 07-21-1960    Referring physician:??Claudene Pellet, MD  PCP:?Smith, Pellet, MD    CHIEF COMPLAINT:?Facial aesthetics    HPI:?  This is a 63 y.o. year old female with history of minifacelift and upper blepharoplasty at the age of 63 yo who presents in consultation for facial aging.    Specifically, the patient is most bothered by the appearance of her midface and neck. She is bothered by the excess skin, wrinkles and fat compartment descent.     History of dry eyes? Denies   Prior refractive surgery? Denies   Use of neurotoxin/fillers? Fillers years ago in cheeks and nasolabial folds.Botox recently. Last HALO in 2024.   Patient denies h/o glaucoma, HTN, coagulopathies. Not on any anticoagulation.      Family History:   Family history is negative for bleeding/clotting disorders, problems with anesthesia, connective tissue disorders.     Social History:   Social History   Socioeconomic History   Marital status: Married    Spouse name: Not on file   Number of children: 1   Years of education: Not on file   Highest education level: Not on file  Occupational History   Occupation: Restaurant Manager, [employer]  Tobacco Use   Smoking status: Never   Smokeless tobacco: Never  Vaping Use   Vaping status: Never Used  Substance and Sexual Activity   Alcohol use: Yes    Comment: rarely   Drug use: No   Sexual activity: Not on file  Other Topics Concern   Not on file  Social History Narrative   Not on file   Social Drivers of Health   Financial Resource Strain: Not on file  Food Insecurity: Not on file  Transportation Needs: Not on file  Physical Activity: Not on file  Stress: Not on file  Social Connections: Not on file    Smoker/Vape Denies  Lives: [place]  Occupation: Retired  Facilities Manager Drug use: Denies   PMH:  Past Medical History:  Diagnosis Date   Asthma    Cataract    Erosive esophagitis    GERD  (gastroesophageal reflux disease)    Grover's disease    Hypothyroidism    Iron deficiency       PSH:  Past Surgical History:  Procedure Laterality Date   ADENOIDECTOMY     APPENDECTOMY     CESAREAN SECTION     PATENT FORAMEN OVALE CLOSURE     TONSILLECTOMY AND ADENOIDECTOMY        MEDICATIONS:?   Current Outpatient Medications:    albuterol  (PROVENTIL ) (2.5 MG/3ML) 0.083% nebulizer solution, Take 3 mLs (2.5 mg total) by nebulization every 6 (six) hours as needed., Disp: 150 mL, Rfl: 2   albuterol  (VENTOLIN  HFA) 108 (90 Base) MCG/ACT inhaler, Can inhale two puffs every four to six hours as needed for cough or wheeze., Disp: 1 each, Rfl: 1   Albuterol -Budesonide (AIRSUPRA ) 90-80 MCG/ACT AERO, Inhale 2 Inhalations into the lungs every 4 (four) hours as needed., Disp: 32.1 g, Rfl: 3   AUVI-Q  0.3 MG/0.3ML SOAJ injection, Inject 0.3 mg into the muscle as needed for anaphylaxis. Use as directed for life-threatening allergic reaction., Disp: 2 each, Rfl: 1   betamethasone valerate (VALISONE) 0.1 % cream, APPLY THIN LAYER TOPICALLY TO THE AFFECTED AREA EVERY DAY, Disp: , Rfl:    buPROPion (WELLBUTRIN XL) 150 MG 24 hr tablet, Take 1 tablet by mouth daily., Disp: , Rfl:    celecoxib  (  CELEBREX ) 200 MG capsule, TAKE 1 CAPSULE (200 MG TOTAL) BY MOUTH 2 (TWO) TIMES DAILY BETWEEN MEALS AS NEEDED., Disp: 60 capsule, Rfl: 1   clobetasol (TEMOVATE) 0.05 % external solution, APPLY TO THE AFFECTED AREA EVERY NIGHT MONDAY THROUGH FRIDAY, Disp: , Rfl:    estradiol (ESTRACE) 0.5 MG tablet, Take 1 tablet every day by oral route., Disp: , Rfl:    ESTRING 2 MG vaginal ring, See admin instructions., Disp: , Rfl: 3   fluticasone  furoate-vilanterol (BREO ELLIPTA ) 200-25 MCG/ACT AEPB, Inhale 1 puff into the lungs daily. Rinse, gargle, and spit after use., Disp: 84 each, Rfl: 3   hydrOXYzine (ATARAX) 50 MG tablet, Take 1 tablet by mouth 2 (two) times daily., Disp: , Rfl:    ipratropium-albuterol  (DUONEB) 0.5-2.5 (3)  MG/3ML SOLN, Can use one vial in nebulizer every four to six hours as needed for cough or wheeze., Disp: 180 mL, Rfl: 1   levocetirizine (XYZAL ) 5 MG tablet, Take 1 tablet (5 mg total) by mouth daily as needed for allergies (Can take an extra dose during flare ups.)., Disp: 180 tablet, Rfl: 3   levothyroxine (SYNTHROID) 25 MCG tablet, 25 mcg daily., Disp: , Rfl:    MOUNJARO 5 MG/0.5ML Pen, , Disp: , Rfl:    Prenatal Vit-Fe Fumarate-FA (PRENATAL VITAMIN PO), Take by mouth daily., Disp: , Rfl:    progesterone (PROMETRIUM) 100 MG capsule, Take 1 capsule by mouth at bedtime., Disp: , Rfl:    triamcinolone  (NASACORT ) 55 MCG/ACT AERO nasal inhaler, Place 2 sprays into the nose daily., Disp: 16.5 g, Rfl: 11   triamcinolone  cream (KENALOG ) 0.1 %, Apply topically 2 (two) times daily., Disp: , Rfl:    UNABLE TO FIND, Intestinal support 1 tab prn, Disp: , Rfl:    XOLAIR  150 MG/ML prefilled syringe, INJECT 150MG  SUBCUTANEOUSLY EVERY 4 WEEKS, Disp: 1 mL, Rfl: 11    ALLERGIES:?  is allergic to other and codeine.    REVIEW OF SYSTEMS:  Review of Systems  ROS negative except as noted in HPI  VITALS:  Blood pressure 104/63, pulse 86, height 5' 2 (1.575 m), weight 134 lb 9.6 oz (61.1 kg), SpO2 96%.   BP 104/63 (BP Location: Left Arm, Patient Position: Sitting, Cuff Size: Normal)   Pulse 86   Ht 5' 2 (1.575 m)   Wt 134 lb 9.6 oz (61.1 kg)   SpO2 96%   BMI 24.62 kg/m   Body mass index is 24.62 kg/m.   General: Well appearing, no apparent distress.  HEENT: Normocephalic, atraumatic, PERRL. Overall aged appearance to periorbital region. Skin quality is poor.   -Brow: There are static and dynamic horizontal rhytids of the forehead. Glabellar hypertophy. Brows are at the level of the supraorbital rim.  -Upper lid:  Mild dermatochalasis of upper lid with excess skin covering lid crease. Previous upper blepharoplasty scar well healed.  -Lower lid: moderate amount of excess skin. Presence of orbital fat  prolapse. Position of lower lid is relative to the lower limbus. Snap back test is delayed. Positive vector. Normal Bell's phenomenon. Prominent orbitomalar crease.   Midface: Overall midfacial descent with midfacial volume atrophy. Prominent nasolabial folds, marionette lines, jowls, submental skin laxity and redundancy, long upper cutaneous lip with deflated upper and lower lips that lack volume.   Neck: skin laxity at midline that extends down to the upper third of her neck, no prominent digastric muscles or submandibular glands, crevicomental angle is blunted but not significantly. She does not have significant adiposity of her  neck, mostly just skin redundancy      ASSESSMENT/PLAN  Assessment & Plan       The risks, benefits, goals and alternatives of lower blepharoplasty were explained to the patient in detail as follows. The surgery is cosmetic in nature, and elective. After surgery it is sometimes the eyelids will not close very well and it is imperative that the patient uses the eye medications as prescribed to protect the eye. The risk of bleeding has been stressed and the patient understands that this can lead to blindness, hence the importance of letting us  know if any swelling on one side usually develops or gets worse and or she develops pain and vision issues, even blindness., Other potentially functional risks discussed include dry eyes, cornea or other eye injury, inability to completely close the eyes, ectropion (rolled out, droopy lower lid), blindness, and need for additional surgery at additional expense. Infection and scarring are not common but can happen. Aesthetic risks or dissatisfaction can arise from asymmetry, and change in the shape of the eyelid that is possible after blepharoplasty. Asymmetry is quite common in every person and sometimes surgery can unmask this or accentuate this. If we are to remove the corrugator muscle, there is a risk of numbness and neuropathic pain in  the forehead.    We discussed temporal browlift. This is usually performed in conjunction with a blepharoplasty. Risks specific to this portion include hair loss, recurrent brow ptosis, asymmetry and VII nerve dysfunction and the usual surgical risks.     We  discussed a facelift and neck lift with bullhorn lip lift. The usual recovery was discussed with 80 % of the swelling and bruising improved by 2 weeks, and the rest by 4 weeks. Make up by 7-10 days usually. Risks specific to the procedure including usual surgical risks such as DVT were discussed such as VII nerve dysfunction, bleeding, skin necrosis, infection, poor scarring, asymmetry, chronic pain and aesthetic disappointment. Sometimes revisions are necessary and at a charge to the patient.     We discussed fat grafting. We are never sure how much fat will remain and other risks relate to donor site infection, irregularity and pain. Irregularity at the site of injection and chronic infection is a remote possibility.  We discussed the significant anti aging benefit of volume replacement.     We discussed perioral rejuvenation including CO2 laser. The usual postoperative course including the 'rawness' of the skin and the need to keep it covered in ointment was stressed. Risks include scarring, pigment change and recalcitrant rhytids.    We also discussed ZO skin care and that this forms the basis of most aesthetic plans for facial rejuvenation. I encourage most patients to consider this as it is non-surgical and makes a big difference.     We did discuss surgery and anesthesia in general and the risks related such as DVT/PE, infection, bleeding, organ injury, and other cardiovascular events. These are unexpected but the risk thereof can never be 0%.     The patient has a good understanding of all the risks and benefits, postoperative course and care. We obtained pictures and will give the patient quotes. All questions were answered.     The  plan agreed upon includes: Facelift,  Neck Lift, Bullhorn Lip lift. Facial Fat Grafting, Perioral CO2 laser, Skin Care, HALO laser before surgery with Dr. Lowery. PCP clearance. Will need Valtrex  7 days before and after surgery.   I spent 45 minutes counseling the patient and  discussing plan of care.  Samy Ryner M Arriyana Rodell

## 2024-06-29 ENCOUNTER — Ambulatory Visit: Admitting: Allergy and Immunology
